# Patient Record
Sex: Male | Born: 1960 | Race: Asian | Hispanic: No | Marital: Married | Smoking: Former smoker
Health system: Southern US, Community
[De-identification: ages and names within clinical notes are randomized; demographics above are authoritative.]

## PROBLEM LIST (undated history)

## (undated) DIAGNOSIS — M79609 Pain in unspecified limb: Secondary | ICD-10-CM

## (undated) DIAGNOSIS — G63 Polyneuropathy in diseases classified elsewhere: Secondary | ICD-10-CM

## (undated) DIAGNOSIS — E291 Testicular hypofunction: Secondary | ICD-10-CM

## (undated) DIAGNOSIS — E559 Vitamin D deficiency, unspecified: Secondary | ICD-10-CM

## (undated) DIAGNOSIS — R350 Frequency of micturition: Secondary | ICD-10-CM

## (undated) DIAGNOSIS — R209 Unspecified disturbances of skin sensation: Secondary | ICD-10-CM

## (undated) DIAGNOSIS — D509 Iron deficiency anemia, unspecified: Secondary | ICD-10-CM

## (undated) DIAGNOSIS — T7840XA Allergy, unspecified, initial encounter: Secondary | ICD-10-CM

## (undated) DIAGNOSIS — R200 Anesthesia of skin: Secondary | ICD-10-CM

## (undated) DIAGNOSIS — R202 Paresthesia of skin: Secondary | ICD-10-CM

## (undated) HISTORY — PX: APPENDECTOMY: SHX54

## (undated) HISTORY — DX: Frequency of micturition: R35.0

## (undated) HISTORY — DX: Polyneuropathy in diseases classified elsewhere: G63

## (undated) HISTORY — DX: Unspecified disturbances of skin sensation: R20.9

## (undated) HISTORY — DX: Allergy, unspecified, initial encounter: T78.40XA

## (undated) HISTORY — DX: Paresthesia of skin: R20.2

## (undated) HISTORY — DX: Pain in unspecified limb: M79.609

## (undated) HISTORY — DX: Vitamin D deficiency, unspecified: E55.9

## (undated) HISTORY — DX: Testicular hypofunction: E29.1

## (undated) HISTORY — DX: Anesthesia of skin: R20.0

## (undated) HISTORY — DX: Iron deficiency anemia, unspecified: D50.9

---

## 2002-01-29 ENCOUNTER — Encounter (INDEPENDENT_AMBULATORY_CARE_PROVIDER_SITE_OTHER): Payer: Self-pay | Admitting: Specialist

## 2002-01-29 ENCOUNTER — Inpatient Hospital Stay (HOSPITAL_COMMUNITY): Admission: EM | Admit: 2002-01-29 | Discharge: 2002-01-31 | Payer: Self-pay | Admitting: Emergency Medicine

## 2002-01-29 ENCOUNTER — Encounter: Payer: Self-pay | Admitting: Emergency Medicine

## 2008-01-22 ENCOUNTER — Emergency Department (HOSPITAL_COMMUNITY): Admission: EM | Admit: 2008-01-22 | Discharge: 2008-01-22 | Payer: Self-pay | Admitting: Family Medicine

## 2008-01-29 ENCOUNTER — Emergency Department (HOSPITAL_COMMUNITY): Admission: EM | Admit: 2008-01-29 | Discharge: 2008-01-29 | Payer: Self-pay | Admitting: Family Medicine

## 2011-04-20 NOTE — Consult Note (Signed)
[place]. [facility]  Patient:    Tanner Robinson, Tanner Robinson Visit Number: [id number] MRN: [id number]          Service Type: MED Location: 1800 [id number] [id number] Attending Physician:  Tobey Bride Dictated by:   Madolyn Frieze. Jens Som, M.D. [facility] Admit Date:  01/29/2002                            Consultation Report  REASON FOR CONSULTATION:  The patient is a 50 year old Asian male with no prior cardiac history who presents for abdominal pain and has been diagnosed with appendicitis.  We were asked to evaluate preoperatively secondary to an abnormal electrocardiogram.  The patient has no prior cardiac history.  He denies any history of palpitations, presyncope, or syncope.  There is no dyspnea on exertion, orthopnea, PND, pedal edema, or chest pain.  There has been no claudication noted.  The patient was admitted with a two-day history of right lower quadrant pain that has escalated.  There was also associated chills and malaise.  An abdominal CT showed appendicitis.  He was scheduled for emergent appendectomy.  An electrocardiogram was obtained preoperatively which is consistent with Brugadas syndrome.  We were asked to further evaluated.  ALLERGIES:  The patient had no known drug allergies.  MEDICATIONS:  He is presently on no medications.  SOCIAL HISTORY:  He has a remote history of tobacco but none in the past year. He occasionally consumes alcohol.  He denies any drug use.  FAMILY HISTORY:  Negative for coronary artery disease.  He denies any sudden deaths in his immediate members.  PAST MEDICAL HISTORY:  There is no diabetes mellitus or hypertension but there is mild hyperlipidemia.  There has been no prior surgeries.  REVIEW OF SYSTEMS:  There is a low grade fever and he does complain of chills. There is no productive cough or hemoptysis.  There is no dysphagia, odynophagia, melena, or hematochezia.  There is no dysuria or hematuria. There is no rash  or seizure activity.  There is no orthopnea, PND, pedal edema.  The remainder of the review of systems are negative.  PHYSICAL EXAMINATION:  VITAL SIGNS:  Blood pressure of 143/78 and his initial temperature was 99.6. His pulse is 103 and his respiratory rate is 16.  GENERAL:  He is well-developed, well-nourished and in moderate distress.  SKIN:  Warm and dry.  HEENT:  Unremarkable with normal EOMI.  NECK:  Supple with normal upstrokes bilaterally and there are no bruits noted. There was no jugular venous distension and no thyromegaly noted.  CHEST:  Clear to auscultation.  Normal expansion.  CARDIOVASCULAR:  Tachycardic rate but regular rhythm.  There are no murmurs, rubs, or gallops noted.  ABDOMEN:  Diffuse tenderness, most pronounced in the right lower quadrant.  EXTREMITIES:  No edema.  I can palpate no cords.  PULSES:  He has 2+ dorsalis pedis pulses bilaterally.  NEUROLOGICAL:  Grossly intact.  LABORATORY DATA:  His electrocardiogram is consistent with Brugadas syndrome with ST elevation in V1 as well as V2.  His potassium is 3.9 with a BUN and creatinine of 14 and 0.7.  His white blood cell count is 13.5.  DIAGNOSES: 1. 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 2. Acute appendicitis.  PLAN:  Mr. Holifield electrocardiogram is consistent with Brugadas syndrome and this would be consistent with his Asian decent.  However, he has had no prior history of syncope, palpitations, or presyncope.  I would therefore  proceed with his scheduled surgery as you are.  Postoperatively, he needs to be on telemetry.  He will most likely require further EP evaluation with electrophysiology study followed by ICD.  However, I will need to discuss this with our electrophysiologist.  We will continue follow while he is in the hospital.Dictated by:   Madolyn Frieze. Jens Som, M.D. [facility] Attending Physician:  Tobey Bride DD:  01/29/02 TD:  01/29/02 Job: [id number]

## 2011-04-20 NOTE — H&P (Signed)
[place]. [facility]  Patient:    Tanner Robinson, Tanner Robinson Visit Number: [id number] MRN: [id number]          Service Type: EMS Location: [place] Attending Physician:  Tobey Bride Dictated by:   Angelia Mould. Derrell Lolling, M.D. Admit Date:  01/29/2002                           History and Physical  CHIEF COMPLAINT:  Abdominal pain.  HISTORY OF PRESENT ILLNESS:  This is a 50 year old [place] Asian gentleman who is previously healthy.  He noted the onset of mid and lower abdominal pain at about 1:15 yesterday.  Pain has been progressive.  The pain is now in the right lower quadrant.  He denies nausea, vomiting or alteration in his bowel habits.  He has no prior similar symptoms.  He came to the emergency room and was evaluated by Dr. Smitty Cords. Cheek.  A CT scan is consistent with acute appendicitis.  The appendix is very swollen and "about to rupture," according to Dr. Cristi Loron.  There is no abscess or free air.  There is no other abnormality on the CT scan.  PAST HISTORY:  He has been healthy.  He denies having any prior surgery, denies any medical illnesses.  CURRENT MEDICATIONS:  Tylenol only.  DRUG ALLERGIES:  None known.  FAMILY HISTORY:  Father died of liver cancer and drank a lot of alcohol. Mothers history is unknown.  REVIEW OF SYSTEMS:  Noncontributory.  PHYSICAL EXAMINATION:  A pleasant, middle-aged [place] gentleman -- speaks English quite well -- in moderate distress.  VITAL SIGNS:  Temperature 99.7, pulse 86, respirations 18, blood pressure 113/70.  HEENT:  Sclerae clear.  Extraocular movements intact.  Oropharynx clear.  NECK:  Supple, nontender.  No mass.  No bruit.  LUNGS:  Clear to auscultation.  HEART:  Regular rate and rhythm.  No murmur.  ABDOMEN:  Somewhat firm and very tender with guarding in the right lower quadrant and to a lesser extent in the left lower quadrant.  I cannot palpate a mass.  There are no  scars.  GENITALIA:  No herniae.  Normal penis, scrotum and testes.  No inflammatory process in the groin.  EXTREMITIES:  No edema.  Good pulses.  NEUROLOGIC:  Grossly within normal limits.  IMPRESSION:  Acute appendicitis.  PLAN:  The patient will be taken to the operating room for appendectomy.  I have discussed the indications and details of surgery with him.  Risks and complications have been outlined, including, but not limited to, bleeding, conversion to open laparotomy, injury to adjacent organs, wound problems such as an infection or hernia.  I have also advised him that he may not have appendicitis, this may be some other inflammatory process that may require open laparotomy, but that appendicitis is most likely.  He understands all of these issues well and at this time, all of his questions are answered.  He agrees and would like for me to go ahead with the surgery at this time. Dictated by:   Angelia Mould. Derrell Lolling, M.D. Attending Physician:  Tobey Bride DD:  01/29/02 TD:  01/29/02 Job: [id number]

## 2011-08-24 LAB — URINALYSIS, ROUTINE W REFLEX MICROSCOPIC
Bilirubin Urine: NEGATIVE
Glucose, UA: NEGATIVE
Hgb urine dipstick: NEGATIVE
Ketones, ur: NEGATIVE
Nitrite: NEGATIVE
Protein, ur: NEGATIVE
Specific Gravity, Urine: 1.017
Urobilinogen, UA: 1
pH: 7

## 2011-08-24 LAB — CBC
HCT: 34.8 — ABNORMAL LOW
Hemoglobin: 11.5 — ABNORMAL LOW
Platelets: 231
WBC: 7.9

## 2011-08-24 LAB — POCT CARDIAC MARKERS
CKMB, poc: <1 — ABNORMAL LOW
CKMB, poc: <1 — ABNORMAL LOW
Myoglobin, poc: 26.2
Operator id: 285841
Troponin i, poc: <0.05
Troponin i, poc: <0.05

## 2011-08-24 LAB — DIFFERENTIAL
Basophils Absolute: 0
Eosinophils Relative: 6 — ABNORMAL HIGH
Lymphocytes Relative: 24
Lymphs Abs: 1.9
Monocytes Absolute: 0.4
Neutro Abs: 5.1

## 2011-08-24 LAB — I-STAT 8, (EC8 V) (CONVERTED LAB)
Acid-Base Excess: 1
Chloride: 107
HCT: 39
Operator id: 285841
TCO2: 29
pCO2, Ven: 47.7
pH, Ven: 7.365 — ABNORMAL HIGH

## 2011-08-24 LAB — POCT I-STAT CREATININE: Operator id: 285841

## 2012-05-20 ENCOUNTER — Ambulatory Visit
Admission: RE | Admit: 2012-05-20 | Discharge: 2012-05-20 | Disposition: A | Discharge status: Home / Self Care | Payer: 59 | Source: Ambulatory Visit | Attending: Nephrology | Admitting: Nephrology

## 2012-05-20 ENCOUNTER — Other Ambulatory Visit: Payer: Self-pay | Admitting: Nephrology

## 2012-05-20 DIAGNOSIS — R52 Pain, unspecified: Secondary | ICD-10-CM

## 2014-04-20 ENCOUNTER — Encounter (HOSPITAL_BASED_OUTPATIENT_CLINIC_OR_DEPARTMENT_OTHER): Payer: Self-pay | Admitting: Emergency Medicine

## 2014-04-20 ENCOUNTER — Emergency Department (HOSPITAL_BASED_OUTPATIENT_CLINIC_OR_DEPARTMENT_OTHER)
Admission: EM | Admit: 2014-04-20 | Discharge: 2014-04-20 | Disposition: A | Discharge status: Home / Self Care | Payer: Worker's Compensation | Attending: Emergency Medicine | Admitting: Emergency Medicine

## 2014-04-20 ENCOUNTER — Emergency Department (HOSPITAL_BASED_OUTPATIENT_CLINIC_OR_DEPARTMENT_OTHER): Payer: Worker's Compensation

## 2014-04-20 DIAGNOSIS — W3189XA Contact with other specified machinery, initial encounter: Secondary | ICD-10-CM | POA: Insufficient documentation

## 2014-04-20 DIAGNOSIS — Z23 Encounter for immunization: Secondary | ICD-10-CM | POA: Insufficient documentation

## 2014-04-20 DIAGNOSIS — Y9289 Other specified places as the place of occurrence of the external cause: Secondary | ICD-10-CM | POA: Insufficient documentation

## 2014-04-20 DIAGNOSIS — Z79899 Other long term (current) drug therapy: Secondary | ICD-10-CM | POA: Insufficient documentation

## 2014-04-20 DIAGNOSIS — Y990 Civilian activity done for income or pay: Secondary | ICD-10-CM | POA: Insufficient documentation

## 2014-04-20 DIAGNOSIS — Z87891 Personal history of nicotine dependence: Secondary | ICD-10-CM | POA: Insufficient documentation

## 2014-04-20 DIAGNOSIS — Y9389 Activity, other specified: Secondary | ICD-10-CM | POA: Insufficient documentation

## 2014-04-20 DIAGNOSIS — S61011A Laceration without foreign body of right thumb without damage to nail, initial encounter: Secondary | ICD-10-CM

## 2014-04-20 DIAGNOSIS — S61209A Unspecified open wound of unspecified finger without damage to nail, initial encounter: Secondary | ICD-10-CM | POA: Insufficient documentation

## 2014-04-20 MED ORDER — HYDROCODONE-ACETAMINOPHEN 5-325 MG PO TABS: 2.0000 | ORAL_TABLET | ORAL | Status: DC | PRN

## 2014-04-20 MED ORDER — CEPHALEXIN 500 MG PO CAPS: 500.0000 mg | ORAL_CAPSULE | Freq: Four times a day (QID) | ORAL | Status: DC

## 2014-04-20 MED ORDER — TETANUS-DIPHTH-ACELL PERTUSSIS 5-2.5-18.5 LF-MCG/0.5 IM SUSP
0.5000 mL | Freq: Once | INTRAMUSCULAR | Status: AC
  Administered 2014-04-20: 0.5 mL via INTRAMUSCULAR
  Filled 2014-04-20: qty 0.5

## 2014-04-20 NOTE — ED Notes (Signed)
Patient transported to X-ray 

## 2014-04-20 NOTE — Discharge Instructions (Signed)
Finger Avulsion  °When the tip of the finger is lost, a new nail may grow back if part of the fingernail is left. The new nail may be deformed. If just the tip of the finger is lost, no repair may be needed unless there is bone showing. If bone is showing, your caregiver may need to remove the protruding bone and put on a bandage. Your caregiver will do what is best for you. Most of the time when a fingertip is lost, the end will gradually grow back on and look fairly normal, but it may remain sensitive to pressure and temperature extremes for a long time. °HOME CARE INSTRUCTIONS  °· Keep your hand elevated above your heart to relieve pain and swelling. °· Keep your dressing dry and clean. °· Change your bandage in 24 hours or as directed. °· Only take over-the-counter or prescription medicines for pain, discomfort, or fever as directed by your caregiver. °· See your caregiver as needed for problems. °SEEK MEDICAL CARE IF:  °· You have increased pain, swelling, drainage, or bleeding. °· You have a fever. °· You have swelling that spreads from your finger and into your hand. °Make sure to check to see if you need a tetanus booster. °Document Released: 01/28/2002 Document Revised: 05/20/2012 Document Reviewed: 12/23/2008 °ExitCare® Patient Information ©2014 [employer]. ° °

## 2014-04-20 NOTE — ED Notes (Signed)
Pt reports smashed finger in machinery at work. Pt has laceration to right thumb

## 2014-04-20 NOTE — ED Provider Notes (Signed)
CSN: [id number]     Arrival date & time 04/20/14  [id number] History   First MD Initiated Contact with Patient 04/20/14 2025     Chief Complaint  Patient presents with  . Laceration     (Consider location/radiation/quality/duration/timing/severity/associated sxs/prior Treatment) Patient is a 53 y.o. male presenting with skin laceration. The history is provided by the patient. No language interpreter was used.  Laceration Location:  Hand Length (cm):  1cm  Depth:  Through underlying tissue Quality: avulsion   Bleeding: controlled with pressure   Injury mechanism: machine. Pain details:    Quality:  Aching   Severity:  Moderate Foreign body present:  No foreign bodies Relieved by:  Nothing Ineffective treatments:  None tried Tetanus status:  Out of date   History reviewed. No pertinent past medical history. Past Surgical History  Procedure Laterality Date  . Appendectomy     No family history on file. History  Substance Use Topics  . Smoking status: Former Games developermoker  . Smokeless tobacco: Not on file  . Alcohol Use: No    Review of Systems  Skin: Positive for wound.  All other systems reviewed and are negative.     Allergies  Review of patient's allergies indicates no known allergies.  Home Medications   Prior to Admission medications   Medication Sig Start Date End Date Taking? Authorizing Provider  gabapentin (NEURONTIN) 300 MG capsule Take 300 mg by mouth 3 (three) times daily.   Yes Historical Provider, MD   BP 140/87  Pulse 70  Temp(Src) 98.1 F (36.7 C) (Oral)  Resp 16  Ht 5\' 3"  (1.6 m)  Wt 155 lb (70.308 kg)  BMI 27.46 kg/m2  SpO2 98% Physical Exam  Nursing note and vitals reviewed. Constitutional: He is oriented to person, place, and time. He appears well-developed and well-nourished.  HENT:  Head: Normocephalic.  Musculoskeletal: He exhibits tenderness.  Avulsed distal tip of right thumb,  No bone palpated,  From  Ns and nv intact  Neurological: He  is alert and oriented to person, place, and time. He has normal reflexes.  Skin: Skin is warm.  Psychiatric: He has a normal mood and affect.    ED Course  Procedures (including critical care time) Labs Review Labs Reviewed - No data to display  Imaging Review No results found.   EKG Interpretation None      MDM no fracture avulsed tip    Final diagnoses:  Laceration of right thumb     I spoke to Dr. Janee Mornhompson who advised have pt call tomorrow to be seen next week.  Lonia SkinnerLeslie K NicholasvilleSofia, PA-C 04/20/14 2134

## 2014-04-20 NOTE — ED Notes (Signed)
MD at bedside. 

## 2014-04-21 NOTE — ED Provider Notes (Signed)
Medical screening examination/treatment/procedure(s) were performed by non-physician practitioner and as supervising physician I was immediately available for consultation/collaboration.  Shanna CiscoMegan E Docherty, MD 04/21/14 2222

## 2014-04-22 ENCOUNTER — Encounter: Payer: Self-pay | Admitting: *Deleted

## 2014-05-03 ENCOUNTER — Encounter: Payer: Self-pay | Admitting: Neurology

## 2014-05-03 ENCOUNTER — Ambulatory Visit (INDEPENDENT_AMBULATORY_CARE_PROVIDER_SITE_OTHER): Payer: 59 | Admitting: Neurology

## 2014-05-03 VITALS — BP 138/80 | HR 64 | Ht 62.99 in | Wt 159.1 lb

## 2014-05-03 DIAGNOSIS — M79605 Pain in left leg: Secondary | ICD-10-CM

## 2014-05-03 DIAGNOSIS — R209 Unspecified disturbances of skin sensation: Secondary | ICD-10-CM

## 2014-05-03 DIAGNOSIS — M79609 Pain in unspecified limb: Secondary | ICD-10-CM

## 2014-05-03 NOTE — Patient Instructions (Signed)
1.  EMG of the left leg 2.  Continue neurontin as you are 3.  Return to clinic in 6 weeks  ELECTROMYOGRAM AND NERVE CONDUCTION STUDIES (EMG/NCS) INSTRUCTIONS  How to Prepare The neurologist conducting the EMG will need to know if you have certain medical conditions. Tell the neurologist and other EMG lab personnel if you:   Have a pacemaker or any other electrical medical device   Take blood-thinning medications   Have hemophilia, a blood-clotting disorder that causes prolonged bleeding Bathing Take a shower or bath shortly before your exam in order to remove oils from your skin. Don't apply lotions or creams before the exam.  What to Expect You'll likely be asked to change into a hospital gown for the procedure and lie down on an examination table. The following explanations can help you understand what will happen during the exam.    Electrodes. The neurologist or a technician places surface electrodes at various locations on your skin depending on where you're experiencing symptoms. Or the neurologist may insert needle electrodes at different sites depending on your symptoms.    Sensations. The electrodes will at times transmit a tiny electrical current that you may feel as a twinge or spasm. The needle electrode may cause discomfort or pain that usually ends shortly after the needle is removed. If you are concerned about discomfort or pain, you may want to talk to the neurologist about taking a short break during the exam.    Instructions. During the needle EMG, the neurologist will assess whether there is any spontaneous electrical activity when the muscle is at rest - activity that isn't present in healthy muscle tissue - and the degree of activity when you slightly contract the muscle.  He or she will give you instructions on resting and contracting a muscle at appropriate times. Depending on what muscles and nerves the neurologist is examining, he or she may ask you to change positions  during the exam.  After your EMG You may experience some temporary, minor bruising where the needle electrode was inserted into your muscle. This bruising should fade within several days. If it persists, contact your primary care doctor.

## 2014-05-03 NOTE — Progress Notes (Signed)
Note faxed.

## 2014-05-03 NOTE — Progress Notes (Signed)
[facility] Neurology Division Clinic Note - Initial Visit   Date: 05/03/2014  Tanner Robinson MRN: [id number] DOB: [birth year]   Dear Dr Julio Sicks:  Thank you Robinson your kind referral of Tanner Robinson consultation of left leg pain/paresthesias. Although his history is well known to you, please allow [place] to reiterate it Robinson the purpose of our medical record. The patient was accompanied to the clinic by his Robinson who also provides collateral information.     History of Present Illness: Tanner Robinson is a 53 y.o. right-handed male from [place] with history of iron deficient anemia presenting Robinson evaluation of left leg pain/paresthesias.    Robinson the past 20 years, he developed left leg pain, described achy and shooting pain. It worsened a few years ago. Pain is located over the buttocks, back of the thigh, lateral surface of the left lower leg and foot.  He has associated numbness.  Denies back pain or tingling.  It is improved when he applies pressure over his foot.  Pain is worse with prolonged standing and resting. There mild improvement with neurontin, but discomfort persists.  He has not tried any physical therapy.  He tried heat pack and massage which did not help.  He reports having a MRI of the back that did not show any abnormalities in the mid 1990s.  He saw his PCP in April who started him on neurontin 300mg  TID and referred her Robinson further evaluation.   Past Medical History  Diagnosis Date  . Urinary frequency   . Numbness and tingling of both legs   . Unspecified vitamin D deficiency   . Pain in limb   . Disturbance of skin sensation   . 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 Iron deficiency anemia, unspecified   . Other testicular hypofunction   . Allergy, unspecified not elsewhere classified     Past Surgical History  Procedure Laterality Date  . Appendectomy       Medications:  Current Outpatient  Prescriptions on File Prior to Visit  Medication Sig Dispense Refill  . cephALEXin (KEFLEX) 500 MG capsule Take 1 capsule (500 mg total) by mouth 4 (four) times daily.  28 capsule  0  . Cholecalciferol (VITAMIN D3) 2000 UNITS TABS Take 1 tablet by mouth daily.      Marland Kitchen gabapentin (NEURONTIN) 300 MG capsule Take 300 mg by mouth 3 (three) times daily.      Marland Kitchen HYDROcodone-acetaminophen (NORCO/VICODIN) 5-325 MG per tablet Take 2 tablets by mouth every 4 (four) hours as needed.  20 tablet  0  . Multiple Vitamin (MULTIVITAMIN) tablet Take 1 tablet by mouth daily.      Marland Kitchen testosterone (ANDRODERM) 4 MG/24HR PT24 patch Place 1 patch onto the skin daily.       No current facility-administered medications on file prior to visit.    Allergies: No Known Allergies  Family History: History reviewed. No pertinent family history.  Social History: History   Social History  . Marital Status: Married    Spouse Name: N/A    Number of Children: N/A  . Years of Education: N/A   Occupational History  . Not on file.   Social History Main Topics  . Smoking status: Never Smoker   . Smokeless tobacco: Never Used  . Alcohol Use: No  . Drug Use: No  . Sexual Activity: Not on file   Other Topics Concern  . Not on file   Social History Narrative  . No narrative on  file    Review of Systems:  CONSTITUTIONAL: No fevers, chills, night sweats, or weight loss.   EYES: No visual changes or eye pain ENT: No hearing changes.  No history of nose bleeds.   RESPIRATORY: No cough, wheezing and shortness of breath.   CARDIOVASCULAR: Negative Robinson chest pain, and palpitations.   GI: Negative Robinson abdominal discomfort, blood in stools or black stools.  No recent change in bowel habits.   GU:  No history of incontinence.   MUSCLOSKELETAL: No history of joint pain or swelling.  No myalgias.   SKIN: Negative Robinson lesions, rash, and itching.   HEMATOLOGY/ONCOLOGY: Negative Robinson prolonged bleeding, bruising easily, and  swollen nodes.  No history of cancer.   ENDOCRINE: Negative Robinson cold or heat intolerance, polydipsia or goiter.   PSYCH:  No depression or anxiety symptoms.   NEURO: As Above.   Vital Signs:  BP 138/80  Pulse 64  Ht 5' 2.99" (1.6 m)  Wt 159 lb 2 oz (72.179 kg)  BMI 28.19 kg/m2  SpO2 96%   General Medical Exam:   General:  Well appearing, comfortable.   Eyes/ENT: see cranial nerve examination.   Neck: No masses appreciated.  Full range of motion without tenderness.  No carotid bruits. Respiratory:  Clear to auscultation, good air entry bilaterally.   Cardiac:  Regular rate and rhythm, no murmur.   Back:  No pain to palpation of spinous processes.   Extremities:  No deformities, edema, or skin discoloration. Good capillary refill.  Pain to palpation distal to left fibular head. Skin:  Skin color, texture, turgor normal. No rashes or lesions.  Neurological Exam: MENTAL STATUS including orientation to time, place, person, recent and remote memory, attention span and concentration, language, and fund of knowledge is normal.  Heavy asian accent.  CRANIAL NERVES: II:  No visual field defects.  Unremarkable fundi.   III-IV-VI: Pupils equal round and reactive to light.  Normal conjugate, extra-ocular eye movements in all directions of gaze.  No nystagmus.  No ptosis.   V:  Normal facial sensation.   VII:  Normal facial symmetry and movements.   VIII:  Normal hearing and vestibular function.   IX-X:  Normal palatal movement.   XI:  Normal shoulder shrug and head rotation.   XII:  Normal tongue strength and range of motion, no deviation or fasciculation.  MOTOR:  No atrophy, fasciculations or abnormal movements.  No pronator drift.  Tone is normal.  Straight leg raise mildly positive on left.  Right Upper Extremity:    Left Upper Extremity:    Deltoid  5/5   Deltoid  5/5   Biceps  5/5   Biceps  5/5   Triceps  5/5   Triceps  5/5   Wrist extensors  5/5   Wrist extensors  5/5   Wrist  flexors  5/5   Wrist flexors  5/5   Finger extensors  5/5   Finger extensors  5/5   Finger flexors  5/5   Finger flexors  5/5   Dorsal interossei  5/5   Dorsal interossei  5/5   Abductor pollicis  5/5   Abductor pollicis  5/5   Tone (Ashworth scale)  0  Tone (Ashworth scale)  0   Right Lower Extremity:    Left Lower Extremity:    Hip flexors  5/5   Hip flexors  5/5   Hip extensors  5/5   Hip extensors  5/5   Knee flexors  5/5  Knee flexors  5/5   Knee extensors  5/5   Knee extensors  5/5   Dorsiflexors  5/5   Dorsiflexors  5/5   Plantarflexors  5/5   Plantarflexors  5/5   Toe extensors  5/5   Toe extensors  5/5   Toe flexors  5/5   Toe flexors  5/5   Tone (Ashworth scale)  0  Tone (Ashworth scale)  0   MSRs:  Right                                                                 Left brachioradialis 2+  brachioradialis 2+  biceps 2+  biceps 2+  triceps 2+  triceps 2+  patellar 2+  patellar 2+  ankle jerk 2+  ankle jerk 2+  Hoffman no  Hoffman no  plantar response down  plantar response down    SENSORY:  Normal and symmetric perception of light touch, pinprick, vibration, and proprioception.  Romberg's sign absent.   COORDINATION/GAIT: Normal finger-to- nose-finger and heel-to-shin.  Intact rapid alternating movements bilaterally.  Able to rise from a chair without using arms.  Gait narrow based and stable. Tandem and stressed gait intact.    IMPRESSION: Mr. Tanner Robinson is a [age] year-old gentleman presenting Robinson evaluation of left leg pain and numbness.  His neurological exam is normal.  Based on history, possibilities include sciatica, S1 radiculopathy, and peroneal mononeuropathy (less likely given symptoms involving his upper leg). EMG will be ordered to help localize and better characterize his symptoms.  In the meantime, neurontin 300mg  TID can be continued.  I'll see him back in 6 weeks   The duration of this appointment visit was 30 minutes of face-to-face time with the  patient.  Greater than 50% of this time was spent in counseling, explanation of diagnosis, planning of further management, and coordination of care.   Thank you Robinson allowing me to participate in patient's care.  If I can answer any additional questions, I would be pleased to do so.    Sincerely,    Vernel Langenderfer K. Allena KatzPatel, DO

## 2014-06-14 ENCOUNTER — Encounter: Payer: Self-pay | Admitting: Neurology

## 2014-06-14 ENCOUNTER — Ambulatory Visit (INDEPENDENT_AMBULATORY_CARE_PROVIDER_SITE_OTHER): Payer: 59 | Admitting: Neurology

## 2014-06-14 DIAGNOSIS — M79609 Pain in unspecified limb: Secondary | ICD-10-CM

## 2014-06-14 DIAGNOSIS — M79605 Pain in left leg: Secondary | ICD-10-CM

## 2014-06-14 DIAGNOSIS — R209 Unspecified disturbances of skin sensation: Secondary | ICD-10-CM

## 2014-06-14 DIAGNOSIS — M5417 Radiculopathy, lumbosacral region: Secondary | ICD-10-CM

## 2014-06-14 NOTE — Procedures (Signed)
[facility]  [address], [address]  [place], [place] [address] Tel: [phone] Fax:  [phone] Test Date:  06/14/2014  Patient: Tanner Robinson DOB: Jun 09, 1961 Physician: Nita Sickleonika Lucciana Head, DO  Sex: Male Height: 5\' 3"  Ref Phys: Nita Sickleatel, Dyonna Jaspers  ID#: [id number] Temp: 32C Technician: Ala BentSusan Reid R. NCS T.   Patient Complaints: Patient is a 53 year old male here today for evaluation of his left leg pain and numbness.  NCV & EMG Findings: Extensive electrodiagnostic testing of the left lower extremity reveals:  1. Sural and superficial peroneal sensory responses are within normal limits.  2. The peroneal and tibial motor responses are within normal limits but  3. Sparse chronic motor axon loss changes are seen affecting the gastrocnemius and biceps femoris short head muscle, without accompanied active denervation.    Impression: The electrophysiologic findings are consistent with a very mild chronic S1 radiculopathy affecting the left lower extremity. In particular, there is no evidence of a generalized sensorimotor polyneuropathy affecting the left lower extremity.   ___________________________ Nita Sickleonika Dupree Givler, DO    Nerve Conduction Studies Anti Sensory Summary Table   Site NR Peak (ms) Norm Peak (ms) P-T Amp (V) Norm P-T Amp  Left Sup Peroneal Anti Sensory (Ant Lat Mall)  12 cm    2.7 <4.6 17.3 >4  Left Sural Anti Sensory (Lat Mall)  Calf    3.4 <4.6 15.9 >4   Motor Summary Table   Site NR Onset (ms) Norm Onset (ms) O-P Amp (mV) Norm O-P Amp Site1 Site2 Delta-0 (ms) Dist (cm) Vel (m/s) Norm Vel (m/s)  Left Peroneal Motor (Ext Dig Brev)  32C  Ankle    4.1 <6.0 5.1 >2.5 B Fib Ankle 5.3 27.0 51 >40  B Fib    9.4  4.9  Poplt B Fib 2.0 10.0 50 >40  Poplt    11.4  4.4         Left Tibial Motor (Abd Hall Brev)  32C  Ankle    3.4 <6.0 10.4 >4 Knee Ankle 6.6 35.0 53 >40  Knee    10.0  9.1          F Wave Studies   NR F-Lat (ms) Lat Norm (ms) L-R F-Lat  (ms)  Left Tibial (Mrkrs) (Abd Hallucis)  32C     45.40 <55    EMG   Side Muscle Ins Act Fibs Psw Fasc Number Recrt Dur Dur. Amp Amp. Poly Poly. Comment  Left AntTibialis Nml Nml Nml Nml Nml Nml Nml Nml Nml Nml Nml Nml N/A  Left Gastroc Nml Nml Nml Nml 1- Mod-V Few 1+ Nml Nml Nml Nml N/A  Left Flex Dig Long Nml Nml Nml Nml Nml Nml Nml Nml Nml Nml Nml Nml N/A  Left RectFemoris Nml Nml Nml Nml Nml Nml Nml Nml Nml Nml Nml Nml N/A  Left GluteusMax Nml Nml Nml Nml Nml Nml Nml Nml Nml Nml Nml Nml N/A  Left BicepsFemS Nml Nml Nml Nml 1- Mod-R Few 1+ Few 1+ Nml Nml N/A      Waveforms:

## 2014-06-21 ENCOUNTER — Encounter: Payer: Self-pay | Admitting: Neurology

## 2014-06-21 ENCOUNTER — Ambulatory Visit (INDEPENDENT_AMBULATORY_CARE_PROVIDER_SITE_OTHER): Payer: 59 | Admitting: Neurology

## 2014-06-21 VITALS — BP 144/74 | HR 72 | Ht 62.6 in | Wt 160.2 lb

## 2014-06-21 DIAGNOSIS — M79605 Pain in left leg: Secondary | ICD-10-CM

## 2014-06-21 DIAGNOSIS — M79609 Pain in unspecified limb: Secondary | ICD-10-CM

## 2014-06-21 NOTE — Progress Notes (Signed)
Put in in provider comments.

## 2014-06-21 NOTE — Progress Notes (Signed)
Follow-up Visit   Date: 06/21/2014    Tanner Robinson MRN: [id number] DOB: 01-24-1961   Interim History: Tanner Cleverhonesavanh Messamore is a 53 y.o. right-handed male from [place] with history of iron deficient anemia returning to the clinic for follow-up of left leg pain and paresthesias.  The patient was accompanied to the clinic by self.  History of present illness: For the past 20 years, he developed left leg pain, described achy and shooting pain. It worsened a few years ago. Pain is located over the buttocks, back of the thigh, lateral surface of the left lower leg and foot. He has associated numbness. Denies back pain or tingling. It is improved when he applies pressure over his foot. Pain is worse with prolonged standing and resting. There mild improvement with neurontin, but discomfort persists. He has not tried any physical therapy. He tried heat pack and massage which did not help. He reports having a MRI of the back that did not show any abnormalities in the mid 1990s. He saw his PCP in April who started him on neurontin 300mg  TID and referred her for further evaluation.  UPDATE 06/21/2014:  He underwent EMG which showed very mild left S1 radiculopathy. There has been no interval change in his symptoms. He does report that hyperinversion of the left foot with his knee and hip flexed tends to alleviate his symptoms. He continues have pain over a very localized area involving the lateral proximal lower leg.  He reports that massaging the muscle and compressive wrapping helps his pain.     Medications:  Current Outpatient Prescriptions on File Prior to Visit  Medication Sig Dispense Refill  . cephALEXin (KEFLEX) 500 MG capsule Take 1 capsule (500 mg total) by mouth 4 (four) times daily.  28 capsule  0  . Cholecalciferol (VITAMIN D3) 2000 UNITS TABS Take 1 tablet by mouth daily.      Marland Kitchen. gabapentin (NEURONTIN) 300 MG capsule Take 300 mg by mouth 3 (three) times daily.      Marland Kitchen.  HYDROcodone-acetaminophen (NORCO/VICODIN) 5-325 MG per tablet Take 2 tablets by mouth every 4 (four) hours as needed.  20 tablet  0  . Multiple Vitamin (MULTIVITAMIN) tablet Take 1 tablet by mouth daily.      Marland Kitchen. testosterone (ANDRODERM) 4 MG/24HR PT24 patch Place 1 patch onto the skin daily.       No current facility-administered medications on file prior to visit.    Allergies: No Known Allergies   Review of Systems:  CONSTITUTIONAL: No fevers, chills, night sweats, or weight loss.   EYES: No visual changes or eye pain ENT: No hearing changes.  No history of nose bleeds.   RESPIRATORY: No cough, wheezing and shortness of breath.   CARDIOVASCULAR: Negative for chest pain, and palpitations.   GI: Negative for abdominal discomfort, blood in stools or black stools.  No recent change in bowel habits.   GU:  No history of incontinence.   MUSCLOSKELETAL: No history of joint pain or swelling.  No myalgias.   SKIN: Negative for lesions, rash, and itching.   ENDOCRINE: Negative for cold or heat intolerance, polydipsia or goiter.   PSYCH:  No depression or anxiety symptoms.   NEURO: As Above.   Vital Signs:  BP 144/74  Pulse 72  Ht 5' 2.6" (1.59 m)  Wt 160 lb 3 oz (72.661 kg)  BMI 28.74 kg/m2  SpO2 98%  Neurological Exam: MENTAL STATUS including orientation to time, place, person, recent and remote memory, attention span  and concentration, language, and fund of knowledge is normal.  Speech is not dysarthric.  MOTOR:  Motor strength is 5/5 in all extremities, including foot eversion and inversion, toe flexion and extension.  No atrophy, fasciculations or abnormal movements. Tone is normal.    MSRs:  Reflexes are 2+/4 throughout.  SENSORY:  Intact to light touch.  COORDINATION/GAIT: Gait narrow based and stable.   Data: EMG 06/14/2014: The electrophysiologic findings are consistent with a very mild chronic S1 radiculopathy affecting the left lower extremity. In particular, there is no  evidence of a generalized sensorimotor polyneuropathy affecting the left lower extremity.   IMPRESSION/PLAN: Mr. Kindle is a 53 year-old gentleman returning for evaluation of chronic left leg discomfort. His neurological exam is normal. He underwent EMG which did not show evidence of a peroneal mononeuropathy. There is a very mild S1 radiculopathy, but based on his symptomatology, it seems to be too mild to account for all of his pain. Other possibilities include sciatica from piriformis syndrome. I have discussed starting physical therapy for stretching and adding muscle relaxant to his medications, however patient reports not having any benefit with muscle relaxants in the past. He would like to seek a second opinion so I will refer him to Dr. Antoine Primas in Sports Medicine.   The duration of this appointment visit was 15 minutes of face-to-face time with the patient.  Greater than 50% of this time was spent in counseling, explanation of diagnosis, planning of further management, and coordination of care.   Thank you for allowing me to participate in patient's care.  If I can answer any additional questions, I would be pleased to do so.    Sincerely,    Zailynn Brandel K. Allena Katz, DO

## 2014-06-21 NOTE — Patient Instructions (Signed)
Referral to Dr. Antoine PrimasZachary Robinson Continue to stretch and ice/heat your left leg.

## 2014-07-02 ENCOUNTER — Ambulatory Visit (INDEPENDENT_AMBULATORY_CARE_PROVIDER_SITE_OTHER): Payer: 59 | Admitting: Family Medicine

## 2014-07-02 ENCOUNTER — Other Ambulatory Visit (INDEPENDENT_AMBULATORY_CARE_PROVIDER_SITE_OTHER): Payer: 59

## 2014-07-02 ENCOUNTER — Encounter: Payer: Self-pay | Admitting: Family Medicine

## 2014-07-02 VITALS — BP 124/82 | HR 68 | Ht 63.0 in | Wt 156.0 lb

## 2014-07-02 DIAGNOSIS — M79605 Pain in left leg: Secondary | ICD-10-CM

## 2014-07-02 DIAGNOSIS — M79609 Pain in unspecified limb: Secondary | ICD-10-CM

## 2014-07-02 DIAGNOSIS — M76892 Other specified enthesopathies of left lower limb, excluding foot: Secondary | ICD-10-CM | POA: Insufficient documentation

## 2014-07-02 DIAGNOSIS — M6580 Other synovitis and tenosynovitis, unspecified site: Secondary | ICD-10-CM

## 2014-07-02 MED ORDER — DICLOFENAC SODIUM 2 % TD SOLN: TRANSDERMAL | Status: DC

## 2014-07-02 NOTE — Progress Notes (Signed)
Tanner Robinson D.O. [facility] [address] [place], [place] [address] Phone: [phone] Subjective:    I'm seeing this patient by the request  of:  Allena Katzatel  CC: Left leg pain  BJY:[id number]:Subjective Tanner Robinson is a 53 y.o. male coming in with complaint of leg pain on the left side. Patient has had this pain for multiple years. Patient has seen neurology for this without any significant improvement. Patient states that he has had this pain for over 20 years. Describes it as more of an aching pain but usually starts on the lateral aspect of his calf and seems to radiate up towards his buttock. Patient states he can be on the back of the thigh and go all the way down near his foot from time to time. He states that sometimes it can be an associated numbness but this seems to be rare. States that it is worse when he stands for a prolonged amount of time. Patient has been given gabapentin which takes the edge off but never completely goes the pain. Denies any side effects to the medication. Patient reports that he has had further imaging including MRI of his back but did not show any significant abnormalities. Patient also denies any back pain associated with it. Patient has tried other home modalities including over-the-counter anti-inflammatories and heat packs as well as massage without any improvement.  While under the care of Dr. Allena KatzPatel the patient was also given an EMG that showed mild left S1 radiculopathy.    X-rays are reviewed by me today. May 20, 2012 patient did have x-rays of the tibia and fibula which are negative. Patient also had back x-rays at the same time that were negative with no osteoarthritic changes.  Patient still does remain active and is able to play soccer as well as volleyball. Patient states afterwards though he does have more pain.     Past medical history, social, surgical and family history all reviewed in electronic medical record.   Review of  Systems: No headache, visual changes, nausea, vomiting, diarrhea, constipation, dizziness, abdominal pain, skin rash, fevers, chills, night sweats, weight loss, swollen lymph nodes, body aches, joint swelling, muscle aches, chest pain, shortness of breath, mood changes.   Objective Blood pressure 124/82, pulse 68, height 5\' 3"  (1.6 m), weight 156 lb (70.761 kg), SpO2 98.00%.  General: No apparent distress alert and oriented x3 mood and affect normal, dressed appropriately.  HEENT: Pupils equal, extraocular movements intact  Respiratory: Patient's speak in full sentences and does not appear short of breath  Cardiovascular: No lower extremity edema, non tender, no erythema  Skin: Warm dry intact with no signs of infection or rash on extremities or on axial skeleton.  Abdomen: Soft nontender  Neuro: Cranial nerves II through XII are intact, neurovascularly intact in all extremities with 2+ DTRs and 2+ pulses.  Lymph: No lymphadenopathy of posterior or anterior cervical chain or axillae bilaterally.  Gait normal with good balance and coordination.  MSK:  Non tender with full range of motion and good stability and symmetric strength and tone of shoulders, elbows, wrist, hip, knee and ankles bilaterally.  Back Exam:  Inspection: Unremarkable  Motion: Flexion 45 deg, Extension 45 deg, Side Bending to 45 deg bilaterally,  Rotation to 45 deg bilaterally  SLR laying: Negative  XSLR laying: Negative  Palpable tenderness: None. FABER: negative. Sensory change: Gross sensation intact to all lumbar and sacral dermatomes.  Reflexes: 2+ at both patellar tendons, 2+ at achilles tendons,  Babinski's downgoing.  Strength at foot  Plantar-flexion: 5/5 Dorsi-flexion: 5/5 Eversion: 5/5 Inversion: 5/5  Leg strength  Quad: 5/5 Hamstring: 5/5 Hip flexor: 5/5 Hip abductors: 5/5  Gait unremarkable. Patient actually does have severe tightness of the hamstrings bilaterally left greater than right. Patient is tender  at the insertion of the hamstring on the lateral aspect of the lower leg as well as over the ischial tuberosity.  MSK US performed of: Left knee This study was ordered, performed, and interpreted by Terrilee Files D.O.  Knee: Left All structures visualized. Anteromedial, anterolateral, posteromedial, and posterolateral menisci unremarkable without tearing, fraying, effusion, or displacement. Patellar Tendon unremarkable on long and transverse views without effusion. No abnormality of prepatellar bursa. LCL and MCL unremarkable on long and transverse views. No abnormality of origin of medial or lateral head of the gastrocnemius. Patient though does have hamstring tendon with hypoechoic changes of potential partial tear that seems to be chronic in nature. Patient does have interest in anatomical changes with patient having what appears to be compression of the popliteal artery coming laterally between the distal hamstring tendon as well as the fibular head.  IMPRESSION:  Distal hamstring tendinitis with questionable abnormal anatomy.     Impression and Recommendations:     This case required medical decision making of moderate complexity.

## 2014-07-02 NOTE — Patient Instructions (Addendum)
Very nice to meet you Ice 20 minutes 2 times daily. Usually after activity, work and before bed. Wear the compression daily especially at work and with soccer.  Exercises 3 times a week Start askling exercises in 2 weeks.  Pennsaid twice daily to area.  Come back in 3 weeks.

## 2014-07-02 NOTE — Assessment & Plan Note (Signed)
The patient's findings are consistent with more of a hamstring injury. Patient is having the pain on the lateral aspect echoes near the insertion just distal to the knee joint itself. In addition a that patient's buttocks pain is likely secondary to pulling on the ischial tuberosity. We discussed different treatment options. Patient was given a compression sleeve today, and topical anti-inflammatories, icing protocol and will come back again in 3 weeks. Differential does include the anatomical differences ICU the patient's popliteal artery going more lateral than staying in the popliteal fossa. I think he could be getting compression with extended amount of tightness to the hamstring tendon. This could be giving us a pseudo-claudication that could also be contributing to his discomfort. Patient is already had a significant workup for his back previously which was unremarkable. Patient does have some S1 radicular symptoms an EMG show some mild abnormal signals we will continue to monitor but is less likely. Patient once again will come back in 3 weeks for further evaluation and treatment.

## 2014-07-26 ENCOUNTER — Ambulatory Visit (INDEPENDENT_AMBULATORY_CARE_PROVIDER_SITE_OTHER): Payer: 59 | Admitting: Family Medicine

## 2014-07-26 ENCOUNTER — Other Ambulatory Visit (INDEPENDENT_AMBULATORY_CARE_PROVIDER_SITE_OTHER): Payer: 59

## 2014-07-26 ENCOUNTER — Encounter: Payer: Self-pay | Admitting: Family Medicine

## 2014-07-26 VITALS — BP 122/72 | HR 67 | Ht 63.0 in | Wt 159.0 lb

## 2014-07-26 DIAGNOSIS — M76892 Other specified enthesopathies of left lower limb, excluding foot: Secondary | ICD-10-CM

## 2014-07-26 DIAGNOSIS — M6580 Other synovitis and tenosynovitis, unspecified site: Secondary | ICD-10-CM

## 2014-07-26 DIAGNOSIS — G5782 Other specified mononeuropathies of left lower limb: Secondary | ICD-10-CM | POA: Insufficient documentation

## 2014-07-26 DIAGNOSIS — G5780 Other specified mononeuropathies of unspecified lower limb: Secondary | ICD-10-CM

## 2014-07-26 NOTE — Assessment & Plan Note (Signed)
Patient did have injection today. No foot drop after injection. Patient did have some numbness but that is it. Patient was able to ambulate without any difficulty. Patient will do icing as well as the continued any topical anti-inflammatory and a compression. The patient is significantly well he can come back and see me again in 3-4 weeks for further evaluation.  Spent greater than 25 minutes with patient face-to-face and had greater than 50% of counseling including as described above in assessment and plan.

## 2014-07-26 NOTE — Patient Instructions (Signed)
It is good to see you Tanner Robinson will always be your friend.  Exercises 3 times a week still for another 6 weeks.  Conitinue the compression with exercise.  I am hoping the injection helps Exercises for the shoulder 3 times a week See you again in 3-4 weeks.

## 2014-07-26 NOTE — Progress Notes (Signed)
Tanner Robinson [address] [place], [place] [address] Phone: [phone] Subjective:    CC: Left leg pain  [id number] Tanner Robinson is a 53 y.o. male coming in with complaint of leg pain on the left side. This has been a chronic problem. Patient was diagnosed with more of a hamstring tendinitis distally. Patient was given a compression sleeve, icing protocol, and we discussed over-the-counter medications it could be helpful. Patient states overall he is limited 5-10% improvement. Patient feels that his legs are stronger but continues to have the same pain. Still starts near the fibular head and can radiate up to his buttocks area. States that it is worse with standing for long amount of time. Continues to be able to do daily activities without any significant difficulty and is resting comfortably at night. Topical anti-inflammatory does help.  While under the care of Dr. Allena Katz the patient was also given an EMG that showed mild left S1 radiculopathy.    X-rays are reviewed by me today. May 20, 2012 patient did have x-rays of the tibia and fibula which are negative. Patient also had back x-rays at the same time that were negative with no osteoarthritic changes.  Patient still does remain active and is able to play soccer as well as volleyball. Patient states afterwards though he does have more pain.     Past medical history, social, surgical and family history all reviewed in electronic medical record.   Review of Systems: No headache, visual changes, nausea, vomiting, diarrhea, constipation, dizziness, abdominal pain, skin rash, fevers, chills, night sweats, weight loss, swollen lymph nodes, body aches, joint swelling, muscle aches, chest pain, shortness of breath, mood changes.   Objective Blood pressure 122/72, pulse 67, height  (1.6 m), weight 159 lb (72.122 kg), SpO2 99.00%.  General: No apparent distress alert and oriented x3 mood and  affect normal, dressed appropriately.  HEENT: Pupils equal, extraocular movements intact  Respiratory: Patient's speak in full sentences and does not appear short of breath  Cardiovascular: No lower extremity edema, non tender, no erythema  Skin: Warm dry intact with no signs of infection or rash on extremities or on axial skeleton.  Abdomen: Soft nontender  Neuro: Cranial nerves II through XII are intact, neurovascularly intact in all extremities with 2+ DTRs and 2+ pulses.  Lymph: No lymphadenopathy of posterior or anterior cervical chain or axillae bilaterally.  Gait normal with good balance and coordination.  MSK:  Non tender with full range of motion and good stability and symmetric strength and tone of shoulders, elbows, wrist, hip, knee and ankles bilaterally.  Back Exam:  Inspection: Unremarkable  Motion: Flexion 45 deg, Extension 45 deg, Side Bending to 45 deg bilaterally,  Rotation to 45 deg bilaterally  SLR laying: Negative  XSLR laying: Negative  Palpable tenderness: None. FABER: negative. Sensory change: Gross sensation intact to all lumbar and sacral dermatomes.  Reflexes: 2+ at both patellar tendons, 2+ at achilles tendons, Babinski's downgoing.  Strength at foot  Plantar-flexion: 5/5 Dorsi-flexion: 5/5 Eversion: 5/5 Inversion: 5/5  Leg strength  Quad: 5/5 Hamstring: 5/5 Hip flexor: 5/5 Hip abductors: 5/5  Gait unremarkable. Patient actually does have severe tightness of the hamstrings bilaterally left greater than right. Patient is tender at the insertion of the hamstring on the lateral aspect of the lower leg as well as over the ischial tuberosity.  MSK US performed of: Left knee This study was ordered, performed, and interpreted by Tanner Robinson D.O.  Knee:  Left All structures visualized. Anteromedial, anterolateral, posteromedial, and posterolateral menisci unremarkable without tearing, fraying, effusion, or displacement. Patellar Tendon unremarkable on long and  transverse views without effusion. No abnormality of prepatellar bursa. LCL and MCL unremarkable on long and transverse views. No abnormality of origin of medial or lateral head of the gastrocnemius. Patient though does have hamstring tendon with hypoechoic changes the tear seems to be improved Patient does have  anatomical changes with patient having what appears to be compression of the popliteal artery coming laterally between the distal hamstring tendon as well as the fibular head. I am able to see sural nerve with scarring.   IMPRESSION:  Distal hamstring tendinitis with questionable abnormal anatomy.   Verbal consent patient was prepped with alcohol swabs and under ultrasound guidance was injected with 0.5 cc of 0.5% Marcaine and 0.5 cc of Kenalog 40 mg/dL surrounding the scarred sural nerve and hydrodissection was accomplished. Patient tolerated the procedure with complete resolution of pain. Patient did have numbness in his toes. Patient did understand the risks and benefits before the injection. Patient still had full strength after the exam of the ankle. No drop foot with associated. Postinjection instructions given.   Impression and Recommendations:     This case required medical decision making of moderate complexity.

## 2014-08-23 ENCOUNTER — Ambulatory Visit: Payer: Self-pay | Admitting: Family Medicine

## 2014-09-06 ENCOUNTER — Ambulatory Visit (INDEPENDENT_AMBULATORY_CARE_PROVIDER_SITE_OTHER): Payer: 59 | Admitting: Family Medicine

## 2014-09-06 ENCOUNTER — Encounter: Payer: Self-pay | Admitting: Family Medicine

## 2014-09-06 ENCOUNTER — Other Ambulatory Visit (INDEPENDENT_AMBULATORY_CARE_PROVIDER_SITE_OTHER): Payer: 59

## 2014-09-06 VITALS — BP 132/80 | HR 70 | Ht 63.0 in | Wt 158.0 lb

## 2014-09-06 DIAGNOSIS — M76892 Other specified enthesopathies of left lower limb, excluding foot: Secondary | ICD-10-CM

## 2014-09-06 DIAGNOSIS — M7072 Other bursitis of hip, left hip: Secondary | ICD-10-CM

## 2014-09-06 DIAGNOSIS — M65852 Other synovitis and tenosynovitis, left thigh: Secondary | ICD-10-CM

## 2014-09-06 NOTE — Assessment & Plan Note (Addendum)
More proximal this time. Patient did have ultrasound-guided injection and its insertion. Patient tolerated the procedure very well. Patient did have some decreased pain after the injection. Patient will do more conservative therapy and increase significant activity for right now. We discussed compression short second be more beneficial. We discussed icing as well and manual massage I can be beneficial. Patient will return in 3 weeks for further evaluation and treatment.  Spent greater than 25 minutes with patient face-to-face and had greater than 50% of counseling including as described above in assessment and plan.

## 2014-09-06 NOTE — Progress Notes (Signed)
[facility] [address] [place], [place] [address] Phone: [phone] Subjective:    CC: Left leg pain  BJY:NWGNFAOZHY Tanner Robinson is a 53 y.o. male coming in with complaint of leg pain on the left side. This has been a chronic problem. Patient was diagnosed with more of a hamstring tendinitis distally.  Patient did have injection around the sural nerve and states that this seems to have improved. Patient though states that now unfortunately the proximal aspect of the hamstring seems to be significantly more tight. The still hurting him more when he stands for long amount of time or stays in any position for a long amount of time. Patient continues to topical anti-inflammatories on a fairly regular basis. Denies any numbness or tingling. States that seems to be localized not getting better. Very tight when he bends over to pick something up.  While under the care of Dr. Allena Katz the patient was also given an EMG that showed mild left S1 radiculopathy.    X-rays are reviewed by me today. May 20, 2012 patient did have x-rays of the tibia and fibula which are negative. Patient also had back x-rays at the same time that were negative with no osteoarthritic changes.    Past medical history, social, surgical and family history all reviewed in electronic medical record.   Review of Systems: No headache, visual changes, nausea, vomiting, diarrhea, constipation, dizziness, abdominal pain, skin rash, fevers, chills, night sweats, weight loss, swollen lymph nodes, body aches, joint swelling, muscle aches, chest pain, shortness of breath, mood changes.   Objective Blood pressure 132/80, pulse 70, height 5\' 3"  (1.6 m), weight 158 lb (71.668 kg), SpO2 97.00%.  General: No apparent distress alert and oriented x3 mood and affect normal, dressed appropriately.  HEENT: Pupils equal, extraocular movements intact  Respiratory: Patient's speak in full sentences and does  not appear short of breath  Cardiovascular: No lower extremity edema, non tender, no erythema  Skin: Warm dry intact with no signs of infection or rash on extremities or on axial skeleton.  Abdomen: Soft nontender  Neuro: Cranial nerves II through XII are intact, neurovascularly intact in all extremities with 2+ DTRs and 2+ pulses.  Lymph: No lymphadenopathy of posterior or anterior cervical chain or axillae bilaterally.  Gait normal with good balance and coordination.  MSK:  Non tender with full range of motion and good stability and symmetric strength and tone of shoulders, elbows, wrist, hip, knee and ankles bilaterally.  Back Exam:  Inspection: Unremarkable  Motion: Flexion 45 deg, Extension 45 deg, Side Bending to 45 deg bilaterally,  Rotation to 45 deg bilaterally  SLR laying: Negative  XSLR laying: Negative  Palpable tenderness: None. FABER: negative. Sensory change: Gross sensation intact to all lumbar and sacral dermatomes.  Reflexes: 2+ at both patellar tendons, 2+ at achilles tendons, Babinski's downgoing.  Strength at foot  Plantar-flexion: 5/5 Dorsi-flexion: 5/5 Eversion: 5/5 Inversion: 5/5  Leg strength  Quad: 5/5 Hamstring: 5/5 Hip flexor: 5/5 Hip abductors: 5/5  Gait unremarkable. Patient actually does have severe tightness of the hamstrings bilaterally left greater than right. Pain is now more over the ischial and not as much on the distal aspect.   MSK US performed of: Left knee This study was ordered, performed, and interpreted by Terrilee Files D.O.  Knee: Left All structures visualized. Anteromedial, anterolateral, posteromedial, and posterolateral menisci unremarkable without tearing, fraying, effusion, or displacement. Patellar Tendon unremarkable on long and transverse views without effusion. No  abnormality of prepatellar bursa. LCL and MCL unremarkable on long and transverse views. No abnormality of origin of medial or lateral head of the  gastrocnemius. Ischial bursitis noted.   IMPRESSION: Ischial bursitis  Procedure: Real-time Ultrasound Guided Injection of left ischial bursa Device: GE Logiq E  Ultrasound guided injection is preferred based studies that show increased duration, increased effect, greater accuracy, decreased procedural pain, increased response rate, and decreased cost with ultrasound guided versus blind injection.  Verbal informed consent obtained.  Time-out conducted.  Noted no overlying erythema, induration, or other signs of local infection.  Skin prepped in a sterile fashion.  Local anesthesia: Topical Ethyl chloride.  With sterile technique and under real time ultrasound guidance:  With a 21-gauge 3 inch needle under ultrasound guidance patient was injected into the ischial bursa. Completed without difficulty  Pain immediately resolved suggesting accurate placement of the medication.  Advised to call if fevers/chills, erythema, induration, drainage, or persistent bleeding.  Images permanently stored and available for review in the ultrasound unit.  Impression: Technically successful ultrasound guided injection.   Impression and Recommendations:     This case required medical decision making of moderate complexity.

## 2014-09-06 NOTE — Patient Instructions (Signed)
Good ot see you That hamstring still gives you trouble.  Ice is your friend Tennis ball in back left pocket with sitting No soccer for 3 days.  New exercises 3 times a week.  See me again in 3 weeks if not  A lot better.

## 2014-09-08 ENCOUNTER — Other Ambulatory Visit: Payer: Self-pay | Admitting: Family Medicine

## 2014-09-08 NOTE — Telephone Encounter (Signed)
Refill done.  

## 2014-09-27 ENCOUNTER — Ambulatory Visit (INDEPENDENT_AMBULATORY_CARE_PROVIDER_SITE_OTHER): Payer: 59 | Admitting: Family Medicine

## 2014-09-27 ENCOUNTER — Encounter: Payer: Self-pay | Admitting: Family Medicine

## 2014-09-27 VITALS — BP 112/80 | HR 72 | Ht 63.0 in | Wt 156.0 lb

## 2014-09-27 DIAGNOSIS — S86812A Strain of other muscle(s) and tendon(s) at lower leg level, left leg, initial encounter: Secondary | ICD-10-CM

## 2014-09-27 DIAGNOSIS — M65852 Other synovitis and tenosynovitis, left thigh: Secondary | ICD-10-CM

## 2014-09-27 DIAGNOSIS — S86819A Strain of other muscle(s) and tendon(s) at lower leg level, unspecified leg, initial encounter: Secondary | ICD-10-CM | POA: Insufficient documentation

## 2014-09-27 DIAGNOSIS — M76892 Other specified enthesopathies of left lower limb, excluding foot: Secondary | ICD-10-CM

## 2014-09-27 NOTE — Patient Instructions (Signed)
You are doing better which is great!!! Ice is still your friend Get a compression sleeve at [employer] or another sporting good store.  New exercises 3-4 times a week.  Wear heel lift in right shoe and see if this helps.  See me again in 3-4 weeks.

## 2014-09-27 NOTE — Assessment & Plan Note (Signed)
Patient is doing remarkably better at this time. Patient will continue to do the exercises as well as a compression fairly regularly. Patient continues to do well we will not make any changes.

## 2014-09-27 NOTE — Progress Notes (Signed)
Tanner Robinson D.O. [facility] [address] [place], [place] [address] Phone: [phone] Subjective:    CC: Left leg pain  BJY:NWGNFAOZHYHPI:Subjective Tanner Kelby FamChantaleukay is a 53 y.o. male coming in with complaint of leg pain on the left side. Patient had hamstring tendinitis distally as well as ischial bursitis proximally. Patient at last visit was given in injection under ultrasound guidance in the ischial bursa. Patient encouraged to go to formal physical therapy which she declined and was only doing home exercises. Patient states that his upper leg is 100% better. Patient is having no pain. Continues to do the exercises and the compression on a regular basis. Patient is very happy with these results.  Patient was coming in with a new problem. States that now the pain seems to be localized in his calf muscle. Patient states that happens when he walks a long amount of distance. States that it is more of a cramping sensation. Denies any weakness. States that it is a dull nagging ache that can occur most days of the week. Denies any nighttime awakening. Does not stop him from any activity.  While under the care of Dr. Allena KatzPatel the patient was also given an EMG that showed mild left S1 radiculopathy.    X-rays are reviewed by me today. May 20, 2012 patient did have x-rays of the tibia and fibula which are negative. Patient also had back x-rays at the same time that were negative with no osteoarthritic changes.    Past medical history, social, surgical and family history all reviewed in electronic medical record.   Review of Systems: No headache, visual changes, nausea, vomiting, diarrhea, constipation, dizziness, abdominal pain, skin rash, fevers, chills, night sweats, weight loss, swollen lymph nodes, body aches, joint swelling, muscle aches, chest pain, shortness of breath, mood changes.   Objective Blood pressure 112/80, pulse 72, height 5\' 3"  (1.6 m), weight 156 lb (70.761 kg), SpO2  96.00%.  General: No apparent distress alert and oriented x3 mood and affect normal, dressed appropriately.  HEENT: Pupils equal, extraocular movements intact  Respiratory: Patient's speak in full sentences and does not appear short of breath  Cardiovascular: No lower extremity edema, non tender, no erythema  Skin: Warm dry intact with no signs of infection or rash on extremities or on axial skeleton.  Abdomen: Soft nontender  Neuro: Cranial nerves II through XII are intact, neurovascularly intact in all extremities with 2+ DTRs and 2+ pulses.  Lymph: No lymphadenopathy of posterior or anterior cervical chain or axillae bilaterally.  Gait normal with good balance and coordination.  MSK:  Non tender with full range of motion and good stability and symmetric strength and tone of shoulders, elbows, wrist, hip, knee and ankles bilaterally.  Back Exam:  Inspection: Unremarkable  Motion: Flexion 45 deg, Extension 45 deg, Side Bending to 45 deg bilaterally,  Rotation to 45 deg bilaterally  SLR laying: Negative  XSLR laying: Negative  Palpable tenderness: None. FABER: negative. Sensory change: Gross sensation intact to all lumbar and sacral dermatomes.  Reflexes: 2+ at both patellar tendons, 2+ at achilles tendons, Babinski's downgoing.  Strength at foot  Plantar-flexion: 5/5 Dorsi-flexion: 5/5 Eversion: 5/5 Inversion: 5/5  Leg strength  Quad: 5/5 Hamstring: 5/5 Hip flexor: 5/5 Hip abductors: 5/5  Gait unremarkable. Continued mild tightness of the hamstrings bilaterally left greater than right. Patient is minimally sore in compression of the calf compared to the contralateral side. No signs of swelling and no inflammation. No redness of the skin. Full  strength compared to contralateral side.      Impression and Recommendations:     This case required medical decision making of moderate complexity.

## 2014-09-27 NOTE — Assessment & Plan Note (Signed)
I believe with patients working on increasing range of motion of his hamstring he is using his calf considerably amount more. We discussed and icing protocol, compression, and given home exercises and showed proper technique. Patient was given a heel lift today which hopefully will help throughout the day. Differential does include a radicular symptoms and patient does have an EMG showing S1 radiculopathy. Patient continues to have difficulty or has worsening symptoms we may need to consider further imaging a patient lower back but I do not think that this is necessary. Patient will follow-up again in 3-4 weeks for further evaluation.  Spent greater than 25 minutes with patient face-to-face and had greater than 50% of counseling including as described above in assessment and plan.

## 2014-10-25 ENCOUNTER — Ambulatory Visit (INDEPENDENT_AMBULATORY_CARE_PROVIDER_SITE_OTHER): Payer: 59 | Admitting: Family Medicine

## 2014-10-25 ENCOUNTER — Ambulatory Visit (INDEPENDENT_AMBULATORY_CARE_PROVIDER_SITE_OTHER)
Admission: RE | Admit: 2014-10-25 | Discharge: 2014-10-25 | Disposition: A | Discharge status: Home / Self Care | Payer: 59 | Source: Ambulatory Visit | Attending: Family Medicine | Admitting: Family Medicine

## 2014-10-25 ENCOUNTER — Encounter: Payer: Self-pay | Admitting: Family Medicine

## 2014-10-25 VITALS — BP 122/80 | HR 71 | Ht 63.0 in | Wt 160.0 lb

## 2014-10-25 DIAGNOSIS — M5416 Radiculopathy, lumbar region: Secondary | ICD-10-CM

## 2014-10-25 NOTE — Assessment & Plan Note (Signed)
Patient has had this chronic pain for quite some time. Patient has not responded well to conservative therapy at this time. I do believe that further imaging is warranted. Patient's last x-rays were greater than 53 years old and we will repeat. Depending on findings I would consider an MRI secondary to patient having increasing weakness compared to previous exam. Discuss continuing conservative therapy at this time and really trying formal physical therapy which patient declined. Patient will continue with the home exercises and will come back and see me again 1-2 days after the MRI to further evaluate.  Spent greater than 25 minutes with patient face-to-face and had greater than 50% of counseling including as described above in assessment and plan.

## 2014-10-25 NOTE — Patient Instructions (Addendum)
Good to see you We will get xray today in the back Mri of the back as well.  They will call you to set it up Ice still and the compression sleeve Continue the exercises at least 3 times a week See me again 1-2 days after the MRI and we will go over it together Happy Thanksgiving.

## 2014-10-25 NOTE — Progress Notes (Signed)
Tanner ScaleZach Tanner Robinson D.O. [facility] [address] [place], [place] [address] Phone: [phone](336) [phone] Subjective:    CC: Left leg pain  OZH:YQMVHQIONGHPI:Subjective Tanner Kelby FamChantaleukay is a 53 y.o. male coming in with complaint of leg pain on the left side. Patient had hamstring tendinitis distally as well as ischial bursitis proximally. Patient at last visit was given in injection under ultrasound guidance in the ischial bursa.  Patient has been wearing a compression sleeve and a regular basis but states that he continues to have difficulty with working. Patient states as long as he wears the compression sleeve on the hamstring the pain in his calf is bearable. Patient states though that every day he continues to have the pain and now he has to sit down from time to time during work. Patient is taking gabapentin fairly regularly without any significant improvement. Still needs a hydrocodone for different breakthrough pain. Patient previously has responded well to a sural nerve injection. Patient states though he would like to not try that again consisted did not seem to keep the pain away entirely. Patient denies any significant back pain but feels that this leg may be getting weaker than prior other leg. Continues to have significant amount cramping.   While under the care of Dr. Allena KatzPatel the patient was also given an EMG that showed mild left S1 radiculopathy.    X-rays are reviewed by me today. May 20, 2012 patient did have x-rays of the tibia and fibula which are negative. Patient also had back x-rays at the same time that were negative with no osteoarthritic changes.    Past medical history, social, surgical and family history all reviewed in electronic medical record.   Review of Systems: No headache, visual changes, nausea, vomiting, diarrhea, constipation, dizziness, abdominal pain, skin rash, fevers, chills, night sweats, weight loss, swollen lymph nodes, body aches, joint swelling, muscle aches,  chest pain, shortness of breath, mood changes.   Objective Blood pressure 122/80, pulse 71, height 5\' 3"  (1.6 m), weight 160 lb (72.576 kg), SpO2 98 %.  General: No apparent distress alert and oriented x3 mood and affect normal, dressed appropriately.  HEENT: Pupils equal, extraocular movements intact  Respiratory: Patient's speak in full sentences and does not appear short of breath  Cardiovascular: No lower extremity edema, non tender, no erythema  Skin: Warm dry intact with no signs of infection or rash on extremities or on axial skeleton.  Abdomen: Soft nontender  Neuro: Cranial nerves II through XII are intact, neurovascularly intact in all extremities with 2+ DTRs and 2+ pulses.  Lymph: No lymphadenopathy of posterior or anterior cervical chain or axillae bilaterally.  Gait normal with good balance and coordination.  MSK:  Non tender with full range of motion and good stability and symmetric strength and tone of shoulders, elbows, wrist, hip, knee and ankles bilaterally.  Back Exam:  Inspection: Unremarkable  Motion: Flexion 45 deg, Extension 45 deg, Side Bending to 45 deg bilaterally,  Rotation to 45 deg bilaterally  SLR laying: Negative  XSLR laying: Negative  Palpable tenderness: None. FABER: negative. Sensory change: Gross sensation intact to all lumbar and sacral dermatomes.  Reflexes: 2+ at both patellar tendons, 2+ at achilles tendons, Babinski's downgoing.  Strength at foot  Plantar-flexion: 4/5 Dorsi-flexion: 5/5 Eversion: 4/5 Inversion: 5/5  Leg strength  Quad: 5/5 Hamstring: 5/5 Hip flexor: 5/5 Hip abductors: 4/5  Gait unremarkable. Continued mild tightness of the hamstrings bilaterally left greater than right. Patient is minimally sore in compression of  the calf compared to the contralateral side. No signs of swelling and no inflammation. No redness of the skin. Mild weakness with plantarflexion compared to the contralateral side.      Impression and  Recommendations:     This case required medical decision making of moderate complexity.

## 2014-11-09 ENCOUNTER — Ambulatory Visit
Admission: RE | Admit: 2014-11-09 | Discharge: 2014-11-09 | Disposition: A | Discharge status: Home / Self Care | Payer: 59 | Source: Ambulatory Visit | Attending: Family Medicine | Admitting: Family Medicine

## 2014-11-09 DIAGNOSIS — M5416 Radiculopathy, lumbar region: Secondary | ICD-10-CM

## 2014-11-24 ENCOUNTER — Encounter: Payer: Self-pay | Admitting: Family Medicine

## 2014-11-24 ENCOUNTER — Ambulatory Visit (INDEPENDENT_AMBULATORY_CARE_PROVIDER_SITE_OTHER): Payer: 59 | Admitting: Family Medicine

## 2014-11-24 VITALS — BP 116/78 | HR 76 | Ht 63.0 in | Wt 157.0 lb

## 2014-11-24 DIAGNOSIS — M5430 Sciatica, unspecified side: Secondary | ICD-10-CM

## 2014-11-24 DIAGNOSIS — M5416 Radiculopathy, lumbar region: Secondary | ICD-10-CM

## 2014-11-24 NOTE — Patient Instructions (Signed)
Good to see you Call the pharmacy for the refill Try the medicine I gave you 3 times a day for 5 days We will get you set up for an injection in your back.  After the injection come back 2 weeks later and we will see how you are doing.  Happy holidays!

## 2014-11-24 NOTE — Progress Notes (Signed)
Tanner Robinson D.O. [facility] [address] [place], [place] [address] Phone: [phone](336) [phone] Subjective:    CC: Left leg pain  BJY:NWGNFAOZHYHPI:Subjective Tanner Robinson is a 53 y.o. male coming in with complaint of leg pain on the left side. Patient had hamstring tendinitis distally as well as ischial bursitis proximally. Patient at last visit was given in injection under ultrasound guidance in the ischial bursa.  Patient has been wearing a compression sleeve and a regular basis but states that he continues to have difficulty with working. .   While under the care of Dr. Allena KatzPatel the patient was also given an EMG that showed mild left S1 radiculopathy.    Patient continued to have difficulty so MRI of the lumbar spine was ordered. MRI was reviewed by me. 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 Patient does have a left-sided central disc protrusion at L5-S1 but there is no specific nerve root impingement on S1.  X-rays are reviewed by me today. May 20, 2012 patient did have x-rays of the tibia and fibula which are negative. Patient also had back x-rays at the same time that were negative with no osteoarthritic changes.  Patient states  he continues to have difficulty. Patient has not notice any significant more weakness than we seen previously. Patient continues with the topical anti-inflammatory but does not feel that it is strong enough. Patient states the gabapentin has not been helpful and has stopped this medication.    Past medical history, social, surgical and family history all reviewed in electronic medical record.   Review of Systems: No headache, visual changes, nausea, vomiting, diarrhea, constipation, dizziness, abdominal pain, skin rash, fevers, chills, night sweats, weight loss, swollen lymph nodes, body aches, joint swelling, muscle aches, chest pain, shortness of breath, mood changes.   Objective Blood pressure 116/78, pulse 76, height 5\' 3"  (1.6 m),  weight 157 lb (71.215 kg), SpO2 98 %.  General: No apparent distress alert and oriented x3 mood and affect normal, dressed appropriately.  HEENT: Pupils equal, extraocular movements intact  Respiratory: Patient's speak in full sentences and does not appear short of breath  Cardiovascular: No lower extremity edema, non tender, no erythema  Skin: Warm dry intact with no signs of infection or rash on extremities or on axial skeleton.  Abdomen: Soft nontender  Neuro: Cranial nerves II through XII are intact, neurovascularly intact in all extremities with 2+ DTRs and 2+ pulses.  Lymph: No lymphadenopathy of posterior or anterior cervical chain or axillae bilaterally.  Gait normal with good balance and coordination.  MSK:  Non tender with full range of motion and good stability and symmetric strength and tone of shoulders, elbows, wrist, hip, knee and ankles bilaterally.  Back Exam:  Inspection: Unremarkable  Motion: Flexion 45 deg, Extension 45 deg, Side Bending to 45 deg bilaterally,  Rotation to 45 deg bilaterally  SLR laying: Moderately positive today on the left side XSLR laying: Negative  Palpable tenderness: None. FABER: negative. Sensory change: Gross sensation intact to all lumbar and sacral dermatomes.  Reflexes: 2+ at both patellar tendons, 2+ at achilles tendons, Babinski's downgoing.  Strength at foot  Plantar-flexion: 4/5 Dorsi-flexion: 5/5 Eversion: 4/5 Inversion: 5/5  Leg strength  Quad: 5/5 Hamstring: 5/5 Hip flexor: 5/5 Hip abductors: 4/5  Gait unremarkable. Continued mild tightness of the hamstrings bilaterally left greater than right. Patient is minimally sore in compression of the calf compared to the contralateral side. No signs of swelling and no inflammation. No redness  of the skin. Mild weakness with plantarflexion compared to the contralateral side. Minimal change from previous exam     Impression and Recommendations:     This case required medical decision  making of moderate complexity.

## 2014-11-24 NOTE — Assessment & Plan Note (Signed)
Discussed with patient at length. Patient has not made any significant improvement even though we have tried multiple different treatment options. Patient's MRI does not show specific nerve root impingement with the findings at the S1 level as well as an EMG showing mild neuropathy I do think that an epidural steroid injection could be beneficial. I think that this could be hopefully therapeutic as well as diagnostic. Patient will continue with all other conservative therapy at this time. Patient still has pain medications if he needs it. She was given a trial of some other oral anti-inflammatories to see if this will be helpful. Discussed icing protocol. If patient continues to have difficulty even after the epidural steroid injection he will come back for further evaluation. At that time I will need to consider more of an S1 nerve root impingement near the piriformis muscle. Ultrasound as well as an ultrasound guided injection could be helpful for this. We will discuss further at follow-up.  Spent greater than 25 minutes with patient face-to-face and had greater than 50% of counseling including as described above in assessment and plan.

## 2014-11-30 ENCOUNTER — Other Ambulatory Visit: Payer: Self-pay | Admitting: Family Medicine

## 2014-12-01 NOTE — Telephone Encounter (Signed)
Refill done.  

## 2014-12-21 ENCOUNTER — Ambulatory Visit
Admission: RE | Admit: 2014-12-21 | Discharge: 2014-12-21 | Disposition: A | Discharge status: Home / Self Care | Payer: 59 | Source: Ambulatory Visit | Attending: Family Medicine | Admitting: Family Medicine

## 2014-12-21 DIAGNOSIS — M5430 Sciatica, unspecified side: Secondary | ICD-10-CM

## 2014-12-21 MED ORDER — METHYLPREDNISOLONE ACETATE 40 MG/ML INJ SUSP (RADIOLOG
120.0000 mg | Freq: Once | INTRAMUSCULAR | Status: AC
  Administered 2014-12-21: 120 mg via EPIDURAL

## 2014-12-21 MED ORDER — IOHEXOL 180 MG/ML  SOLN
1.0000 mL | Freq: Once | INTRAMUSCULAR | Status: AC | PRN
  Administered 2014-12-21: 1 mL via EPIDURAL

## 2014-12-21 NOTE — Discharge Instructions (Signed)

## 2016-04-28 ENCOUNTER — Ambulatory Visit (INDEPENDENT_AMBULATORY_CARE_PROVIDER_SITE_OTHER): Payer: 59 | Admitting: Family Medicine

## 2016-04-28 VITALS — BP 130/80 | HR 69 | Temp 98.2°F | Resp 17 | Ht 62.5 in | Wt 159.1 lb

## 2016-04-28 DIAGNOSIS — H1011 Acute atopic conjunctivitis, right eye: Secondary | ICD-10-CM

## 2016-04-28 DIAGNOSIS — H60391 Other infective otitis externa, right ear: Secondary | ICD-10-CM

## 2016-04-28 DIAGNOSIS — R21 Rash and other nonspecific skin eruption: Secondary | ICD-10-CM

## 2016-04-28 MED ORDER — AZELASTINE HCL 0.05 % OP SOLN: 1.0000 [drp] | Freq: Two times a day (BID) | OPHTHALMIC | Status: AC

## 2016-04-28 MED ORDER — OFLOXACIN 0.3 % OT SOLN: 10.0000 [drp] | Freq: Every day | OTIC | Status: AC

## 2016-04-28 MED ORDER — TRIAMCINOLONE ACETONIDE 0.1 % EX CREA: 1.0000 "application " | TOPICAL_CREAM | Freq: Two times a day (BID) | CUTANEOUS | Status: AC

## 2016-04-28 NOTE — Patient Instructions (Addendum)
IF you received an x-ray today, you will receive an invoice from [facility]. Please contact [facility] at [phone] with questions or concerns regarding your invoice.   IF you received labwork today, you will receive an invoice from [employer]. Please contact [facility] at [phone] with questions or concerns regarding your invoice.   Our billing staff will not be able to assist you with questions regarding bills from these companies.  You will be contacted with the lab results as soon as they are available. The fastest way to get your results is to activate your My Chart account. Instructions are located on the last page of this paperwork. If you have not heard from [place] regarding the results in 2 weeks, please contact this office.    For your ear, use the eardrops as instructed for possible external ear infection or infection from your middle ear that I am unable to see today. If the symptoms are not improved in the next week to 10 days, or any worsening sooner, return for recheck as you may need to see ear nose and throat.  Okay to use over-the-counter Systane or Blink eyedrops, but I also sent in Optivar drops for possible allergy symptoms of your eye. If this is not improving, let me know and I can refer you to an eye care provider.  Steroid cream to the itching areas on your back as needed, but if this persists, recommend recheck here or your primary care provider.  Return to the clinic or go to the nearest emergency room if any of your symptoms worsen or new symptoms occur.  Otitis Externa Otitis externa is a bacterial or fungal infection of the outer ear canal. This is the area from the eardrum to the outside of the ear. Otitis externa is sometimes called "swimmer's ear." CAUSES  Possible causes of infection include:  Swimming in dirty water.  Moisture remaining in the ear after swimming or bathing.  Mild injury (trauma) to  the ear.  Objects stuck in the ear (foreign body).  Cuts or scrapes (abrasions) on the outside of the ear. SIGNS AND SYMPTOMS  The first symptom of infection is often itching in the ear canal. Later signs and symptoms may include swelling and redness of the ear canal, ear pain, and yellowish-white fluid (pus) coming from the ear. The ear pain may be worse when pulling on the earlobe. DIAGNOSIS  Your health care provider will perform a physical exam. A sample of fluid may be taken from the ear and examined for bacteria or fungi. TREATMENT  Antibiotic ear drops are often given for 10 to 14 days. Treatment may also include pain medicine or corticosteroids to reduce itching and swelling. HOME CARE INSTRUCTIONS   Apply antibiotic ear drops to the ear canal as prescribed by your health care provider.  Take medicines only as directed by your health care provider.  If you have diabetes, follow any additional treatment instructions from your health care provider.  Keep all follow-up visits as directed by your health care provider. PREVENTION   Keep your ear dry. Use the corner of a towel to absorb water out of the ear canal after swimming or bathing.  Avoid scratching or putting objects inside your ear. This can damage the ear canal or remove the protective wax that lines the canal. This makes it easier for bacteria and fungi to grow.  Avoid swimming in lakes, polluted water, or poorly chlorinated pools.  You may use  ear drops made of rubbing alcohol and vinegar after swimming. Combine equal parts of white vinegar and alcohol in a bottle. Put 3 or 4 drops into each ear after swimming. SEEK MEDICAL CARE IF:   You have a fever.  Your ear is still red, swollen, painful, or draining pus after 3 days.  Your redness, swelling, or pain gets worse.  You have a severe headache.  You have redness, swelling, pain, or tenderness in the area behind your ear. MAKE SURE YOU:   Understand these  instructions.  Will watch your condition.  Will get help right away if you are not doing well or get worse.   This information is not intended to replace advice given to you by your health care provider. Make sure you discuss any questions you have with your health care provider.   Document Released: 11/19/2005 Document Revised: 12/10/2014 Document Reviewed: 12/06/2011 Elsevier Interactive Patient Education 2016 [employer]  Allergic Conjunctivitis Allergic conjunctivitis is inflammation of the clear membrane that covers the white part of your eye and the inner surface of your eyelid (conjunctiva), and it is caused by allergies. The blood vessels in the conjunctiva become inflamed, and this causes the eye to become red or pink, and it often causes itchiness in the eye. Allergic conjunctivitis cannot be spread by one person to another person (noncontagious). CAUSES This condition is caused by an allergic reaction. Common causes of an allergic reaction (allergens) include:  Dust.  Pollen.  Mold.  Animal dander or secretions. RISK FACTORS This condition is more likely to develop if you are exposed to high levels of allergens that cause the allergic reaction. This might include being outdoors when air pollen levels are high or being around animals that you are allergic to. SYMPTOMS Symptoms of this condition may include:  Eye redness.  Tearing of the eyes.  Watery eyes.  Itchy eyes.  Burning feeling in the eyes.  Clear drainage from the eyes.  Swollen eyelids. DIAGNOSIS This condition may be diagnosed by medical history and physical exam. If you have drainage from your eyes, it may be tested to rule out other causes of conjunctivitis. TREATMENT Treatment for this condition often includes medicines. These may be eye drops, ointments, or oral medicines. They may be prescription medicines or over-the-counter medicines. HOME CARE INSTRUCTIONS  Take or apply medicines only as  directed by your health care provider.  Do not touch or rub your eyes.  Do not wear contact lenses until the inflammation is gone. Wear glasses instead.  Do not wear eye makeup until the inflammation is gone.  Apply a cool, clean washcloth to your eye for 10-20 minutes, 3-4 times a day.  Try to avoid whatever allergen is causing the allergic reaction. SEEK MEDICAL CARE IF:  Your symptoms get worse.  You have pus draining from your eye.  You have new symptoms.  You have a fever.   This information is not intended to replace advice given to you by your health care provider. Make sure you discuss any questions you have with your health care provider.   Document Released: 02/09/2003 Document Revised: 12/10/2014 Document Reviewed: 08/31/2014 Elsevier Interactive Patient Education [employer]2016 Elsevier Inc.

## 2016-04-28 NOTE — Progress Notes (Signed)
By signing my name below I, Tanner Robinson, attest that this documentation has been prepared under the direction and in the presence of [name], MD. Electonically Signed. Tanner Robinson, Scribe 04/28/2016 at 1:00 PM  Subjective:    Patient ID: Tanner Robinson, male    DOB: 03/11/1961, 55 y.o.   MRN: [id number]  Chief Complaint  Patient presents with  . Ear Fullness    feels like ear draining also-right ear x 2 weeks  . Itchy Eye    right eye since last night  . Rash    Back and neck    HPI Tanner Robinson is a 55 y.o. male who presents to the [facility] complaining of rt ear drainage for the past 2 weeks. Rt ear has a fullness sensation and decreased hearing. Pt denies any recent swimming. Pt has been sneezing.   Pt also c/o rt eye itchiness since last night. Symptoms started after cleaning up at work yesterday. Pt denies any vision problems. Pt reports allergies. Pt takes claritin. Pt has been using an eye drop for dry eyes with minimal relief. Pt wears glasses. Pt does not use contacts.  Pt has a rash on his back and neck. Pt has had rash for a while and has been evaluated and told it was related to food.     Patient Active Problem List   Diagnosis Date Noted  . Lumbar radiculopathy 10/25/2014  . Strain of calf muscle 09/27/2014  . Ischial bursitis of left side 09/06/2014  . 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 Hamstring tendinitis of left thigh 07/02/2014   Past Medical History  Diagnosis Date  . Urinary frequency   . Numbness and tingling of both legs   . Unspecified vitamin D deficiency   . Pain in limb   . Disturbance of skin sensation   . Polyneuropathy in other diseases classified elsewhere (HCC)   . Iron deficiency anemia, unspecified   . Other testicular hypofunction   . Allergy, unspecified not elsewhere classified    Past Surgical History  Procedure Laterality Date  . Appendectomy     No Known  Allergies Prior to Admission medications   Medication Sig Start Date End Date Taking? Authorizing Provider  loratadine (CLARITIN) 10 MG tablet Take 10 mg by mouth daily.   Yes Historical Provider, MD  cephALEXin (KEFLEX) 500 MG capsule Take 1 capsule (500 mg total) by mouth 4 (four) times daily. Patient not taking: Reported on 04/28/2016 04/20/14   Elson Areas, PA-C  Cholecalciferol (VITAMIN D3) 2000 UNITS TABS Take 1 tablet by mouth daily. Reported on 04/28/2016    Historical Provider, MD  gabapentin (NEURONTIN) 300 MG capsule Take 300 mg by mouth 3 (three) times daily. Reported on 04/28/2016    Historical Provider, MD  HYDROcodone-acetaminophen (NORCO/VICODIN) 5-325 MG per tablet Take 2 tablets by mouth every 4 (four) hours as needed. Patient not taking: Reported on 04/28/2016 04/20/14   Elson Areas, PA-C  Multiple Vitamin (MULTIVITAMIN) tablet Take 1 tablet by mouth daily. Reported on 04/28/2016    Historical Provider, MD  PENNSAID 2 % SOLN APPLY PUMPS TOPICALLY TWO TIMES A DAY Patient not taking: Reported on 04/28/2016 12/01/14   Judi Saa, DO  testosterone Cathren Laine) 4 MG/24HR PT24 patch Place 1 patch onto the skin daily. Reported on 04/28/2016    Historical Provider, MD   Social History   Social History  . Marital Status: Married    Spouse Name: N/A  .  Number of Children: N/A  . Years of Education: N/A   Occupational History  . Not on file.   Social History Main Topics  . Smoking status: Former Smoker -- 1.00 packs/day for 20 years    Types: Cigarettes    Quit date: 12/04/1999  . Smokeless tobacco: Never Used  . Alcohol Use: No  . Drug Use: No  . Sexual Activity: Not on file   Other Topics Concern  . Not on file   Social History Narrative   He works as a Manufacturing systems engineermachine operator   Highest level of education:  High school   He lives with wife and they have two children.      Review of Systems  HENT: Positive for congestion and ear discharge.   Eyes: Positive for  itching. Negative for visual disturbance.  Skin: Positive for rash.       Objective:   Physical Exam  HENT:  Right Ear: There is drainage (yellow discharge).  Left Ear: Tympanic membrane and ear canal normal.  Pt has swelling and redness in rt ear canal, cannot view TM due to drainage.  Eyes: EOM are normal. Pupils are equal, round, and reactive to light. Right eye exhibits no discharge. Left eye exhibits no discharge. Left conjunctiva is injected (minimal injection of sclera).  Pt has upper and lower lid edema of the rt eye. Fundoscopic exam: 2 drops proparacaine applied, lids everted, no FB, no sty. Fluorescence applied: no scratches, no FB seen.   Lymphadenopathy:       Head (right side): No preauricular adenopathy present.       Head (left side): No preauricular adenopathy present.  Skin:  Pt has a few excoriated areas along lower rt paralumbar region. No other rash across back or neck.      Filed Vitals:   04/28/16 1123  BP: 130/80  Pulse: 69  Temp: 98.2 F (36.8 C)  TempSrc: Oral  Resp: 17  Height: 5' 2.5" (1.588 m)  Weight: 159 lb 2 oz (72.179 kg)  SpO2: 98%        Assessment & Plan:   Tanner [name] is a 55 y.o. male Allergic conjunctivitis, right - Plan: azelastine (OPTIVAR) 0.05 % ophthalmic solution  -No sign of abrasion or foreign body on exam, noted after dust exposure. Stay. May have component of allergic conjunctivitis versus residual irritation from previous days dust exposure. Can try Systane or blink eyedrops, Optivar twice a day as needed for allergic conjunctivae, RTC precautions if not improving, and can refer to Optho if needed.   Otitis, externa, infective, right - Plan: ofloxacin (FLOXIN OTIC) 0.3 % otic solution  - Otitis externa versus chronic medially with rupture. Unable to visualize TM, but will initially start with Floxin Otic drops. 10 drops daily, then if not improving within the week to 10 days, return for recheck or refer to ENT  for further evaluation. Sooner if worse.  Rash of back - Plan: triamcinolone cream (KENALOG) 0.1 %  -Atopic dermatitis versus dry skin. Trial of Kenalog topically twice a day when necessary, return for further evaluation if persistent.   Meds ordered this encounter  Medications  . loratadine (CLARITIN) 10 MG tablet    Sig: Take 10 mg by mouth daily.  Marland Kitchen. triamcinolone cream (KENALOG) 0.1 %    Sig: Apply 1 application topically 2 (two) times daily.    Dispense:  30 g    Refill:  0  . ofloxacin (FLOXIN OTIC) 0.3 % otic solution  Sig: Place 10 drops into the right ear daily.    Dispense:  10 mL    Refill:  0  . azelastine (OPTIVAR) 0.05 % ophthalmic solution    Sig: Place 1 drop into the right eye 2 (two) times daily.    Dispense:  6 mL    Refill:  1   Patient Instructions       IF you received an x-ray today, you will receive an invoice from [facility]. Please contact [facility] at [phone] with questions or concerns regarding your invoice.   IF you received labwork today, you will receive an invoice from [employer]. Please contact [facility] at [phone] with questions or concerns regarding your invoice.   Our billing staff will not be able to assist you with questions regarding bills from these companies.  You will be contacted with the lab results as soon as they are available. The fastest way to get your results is to activate your My Chart account. Instructions are located on the last page of this paperwork. If you have not heard from [place] regarding the results in 2 weeks, please contact this office.    For your ear, use the eardrops as instructed for possible external ear infection or infection from your middle ear that I am unable to see today. If the symptoms are not improved in the next week to 10 days, or any worsening sooner, return for recheck as you may need to see ear nose and throat.  Okay to use over-the-counter  Systane or Blink eyedrops, but I also sent in Optivar drops for possible allergy symptoms of your eye. If this is not improving, let me know and I can refer you to an eye care provider.  Steroid cream to the itching areas on your back as needed, but if this persists, recommend recheck here or your primary care provider.  Return to the clinic or go to the nearest emergency room if any of your symptoms worsen or new symptoms occur.  Otitis Externa Otitis externa is a bacterial or fungal infection of the outer ear canal. This is the area from the eardrum to the outside of the ear. Otitis externa is sometimes called "swimmer's ear." CAUSES  Possible causes of infection include:  Swimming in dirty water.  Moisture remaining in the ear after swimming or bathing.  Mild injury (trauma) to the ear.  Objects stuck in the ear (foreign body).  Cuts or scrapes (abrasions) on the outside of the ear. SIGNS AND SYMPTOMS  The first symptom of infection is often itching in the ear canal. Later signs and symptoms may include swelling and redness of the ear canal, ear pain, and yellowish-white fluid (pus) coming from the ear. The ear pain may be worse when pulling on the earlobe. DIAGNOSIS  Your health care provider will perform a physical exam. A sample of fluid may be taken from the ear and examined for bacteria or fungi. TREATMENT  Antibiotic ear drops are often given for 10 to 14 days. Treatment may also include pain medicine or corticosteroids to reduce itching and swelling. HOME CARE INSTRUCTIONS   Apply antibiotic ear drops to the ear canal as prescribed by your health care provider.  Take medicines only as directed by your health care provider.  If you have diabetes, follow any additional treatment instructions from your health care provider.  Keep all follow-up visits as directed by your health care provider. PREVENTION   Keep your ear dry. Use the  corner of a towel to absorb water out of  the ear canal after swimming or bathing.  Avoid scratching or putting objects inside your ear. This can damage the ear canal or remove the protective wax that lines the canal. This makes it easier for bacteria and fungi to grow.  Avoid swimming in lakes, polluted water, or poorly chlorinated pools.  You may use ear drops made of rubbing alcohol and vinegar after swimming. Combine equal parts of white vinegar and alcohol in a bottle. Put 3 or 4 drops into each ear after swimming. SEEK MEDICAL CARE IF:   You have a fever.  Your ear is still red, swollen, painful, or draining pus after 3 days.  Your redness, swelling, or pain gets worse.  You have a severe headache.  You have redness, swelling, pain, or tenderness in the area behind your ear. MAKE SURE YOU:   Understand these instructions.  Will watch your condition.  Will get help right away if you are not doing well or get worse.   This information is not intended to replace advice given to you by your health care provider. Make sure you discuss any questions you have with your health care provider.   Document Released: 11/19/2005 Document Revised: 12/10/2014 Document Reviewed: 12/06/2011 Elsevier Interactive Patient Education 2016 [employer].  Allergic Conjunctivitis Allergic conjunctivitis is inflammation of the clear membrane that covers the white part of your eye and the inner surface of your eyelid (conjunctiva), and it is caused by allergies. The blood vessels in the conjunctiva become inflamed, and this causes the eye to become red or pink, and it often causes itchiness in the eye. Allergic conjunctivitis cannot be spread by one person to another person (noncontagious). CAUSES This condition is caused by an allergic reaction. Common causes of an allergic reaction (allergens) include:  Dust.  Pollen.  Mold.  Animal dander or secretions. RISK FACTORS This condition is more likely to develop if you are exposed to  high levels of allergens that cause the allergic reaction. This might include being outdoors when air pollen levels are high or being around animals that you are allergic to. SYMPTOMS Symptoms of this condition may include:  Eye redness.  Tearing of the eyes.  Watery eyes.  Itchy eyes.  Burning feeling in the eyes.  Clear drainage from the eyes.  Swollen eyelids. DIAGNOSIS This condition may be diagnosed by medical history and physical exam. If you have drainage from your eyes, it may be tested to rule out other causes of conjunctivitis. TREATMENT Treatment for this condition often includes medicines. These may be eye drops, ointments, or oral medicines. They may be prescription medicines or over-the-counter medicines. HOME CARE INSTRUCTIONS  Take or apply medicines only as directed by your health care provider.  Do not touch or rub your eyes.  Do not wear contact lenses until the inflammation is gone. Wear glasses instead.  Do not wear eye makeup until the inflammation is gone.  Apply a cool, clean washcloth to your eye for 10-20 minutes, 3-4 times a day.  Try to avoid whatever allergen is causing the allergic reaction. SEEK MEDICAL CARE IF:  Your symptoms get worse.  You have pus draining from your eye.  You have new symptoms.  You have a fever.   This information is not intended to replace advice given to you by your health care provider. Make sure you discuss any questions you have with your health care provider.   Document Released: 02/09/2003 Document  Revised: 12/10/2014 Document Reviewed: 08/31/2014 Elsevier Interactive Patient Education [employer]     I personally performed the services described in this documentation, which was scribed in my presence. The recorded information has been reviewed and considered, and addended by me as needed.

## 2016-07-21 ENCOUNTER — Emergency Department (HOSPITAL_COMMUNITY)
Admission: EM | Admit: 2016-07-21 | Discharge: 2016-07-21 | Disposition: A | Discharge status: Home / Self Care | Payer: 59 | Attending: Emergency Medicine | Admitting: Emergency Medicine

## 2016-07-21 ENCOUNTER — Encounter (HOSPITAL_COMMUNITY): Payer: Self-pay

## 2016-07-21 DIAGNOSIS — R1013 Epigastric pain: Secondary | ICD-10-CM | POA: Insufficient documentation

## 2016-07-21 DIAGNOSIS — Z87891 Personal history of nicotine dependence: Secondary | ICD-10-CM | POA: Diagnosis not present

## 2016-07-21 DIAGNOSIS — Z79899 Other long term (current) drug therapy: Secondary | ICD-10-CM | POA: Insufficient documentation

## 2016-07-21 DIAGNOSIS — R1084 Generalized abdominal pain: Secondary | ICD-10-CM | POA: Diagnosis present

## 2016-07-21 LAB — COMPREHENSIVE METABOLIC PANEL
ALT: 24 U/L (ref 17–63)
AST: 22 U/L (ref 15–41)
Albumin: 4.3 g/dL (ref 3.5–5.0)
Alkaline Phosphatase: 45 U/L (ref 38–126)
Anion gap: 7 (ref 5–15)
BUN: 13 mg/dL (ref 6–20)
CHLORIDE: 103 mmol/L (ref 101–111)
CO2: 27 mmol/L (ref 22–32)
CREATININE: 0.65 mg/dL (ref 0.61–1.24)
Calcium: 9.3 mg/dL (ref 8.9–10.3)
GFR calc Af Amer: >60 mL/min (ref >60)
GFR calc non Af Amer: >60 mL/min (ref >60)
GLUCOSE: 105 mg/dL — AB (ref 65–99)
Potassium: 3.8 mmol/L (ref 3.5–5.1)
SODIUM: 137 mmol/L (ref 135–145)
Total Bilirubin: 0.7 mg/dL (ref 0.3–1.2)
Total Protein: 7.3 g/dL (ref 6.5–8.1)

## 2016-07-21 LAB — LIPASE, BLOOD: Lipase: 22 U/L (ref 11–51)

## 2016-07-21 LAB — URINALYSIS, ROUTINE W REFLEX MICROSCOPIC
Bilirubin Urine: NEGATIVE
Glucose, UA: NEGATIVE mg/dL
HGB URINE DIPSTICK: NEGATIVE
Ketones, ur: NEGATIVE mg/dL
Leukocytes, UA: NEGATIVE
Nitrite: NEGATIVE
PROTEIN: NEGATIVE mg/dL
Specific Gravity, Urine: 1.037 — ABNORMAL HIGH (ref 1.005–1.030)
pH: 5.5 (ref 5.0–8.0)

## 2016-07-21 LAB — CBC
HCT: 35.9 % — ABNORMAL LOW (ref 39.0–52.0)
Hemoglobin: 12 g/dL — ABNORMAL LOW (ref 13.0–17.0)
MCH: 23.1 pg — AB (ref 26.0–34.0)
MCHC: 33.4 g/dL (ref 30.0–36.0)
MCV: 69.2 fL — AB (ref 78.0–100.0)
PLATELETS: 210 10*3/uL (ref 150–400)
RBC: 5.19 MIL/uL (ref 4.22–5.81)
RDW: 14.9 % (ref 11.5–15.5)
WBC: 7.7 10*3/uL (ref 4.0–10.5)

## 2016-07-21 MED ORDER — GI COCKTAIL ~~LOC~~
30.0000 mL | Freq: Once | ORAL | Status: AC
  Administered 2016-07-21: 30 mL via ORAL
  Filled 2016-07-21: qty 30

## 2016-07-21 NOTE — Discharge Instructions (Signed)
Try zantac 150mg twice a day.  ° ° °

## 2016-07-21 NOTE — ED Triage Notes (Signed)
Onset this morning abd pain "all over" and gas.  Had BM when pt arrived to ED, small hard stool.  No other s/s noted.

## 2016-07-21 NOTE — ED Notes (Signed)
Pt and family understood dc material. NAD noted 

## 2016-07-21 NOTE — ED Provider Notes (Signed)
[facility] Provider Note   CSN: [id number] Arrival date & time: 07/21/16  1726     History   Chief Complaint Chief Complaint  Patient presents with  . Abdominal Pain    HPI Tanner Robinson is a 55 y.o. male.  55 yo M with a chief complaint of diffuse abdominal pain. The started yesterday after having a carbonated drink. Patient denies fevers or chills denies vomiting but has had some mild nausea. Denies diarrhea or constipation. Pain is diffuse and colicky. Denies radiation. Tried some anti-gas medication without improvement. Has a history of a appendectomy.   The history is provided by the patient and the spouse.  Abdominal Pain   This is a new problem. The current episode started yesterday. The problem occurs constantly. The problem has not changed since onset.The pain is associated with eating. The pain is located in the epigastric region. The pain is at a severity of 5/10. The pain is moderate. Associated symptoms include nausea. Pertinent negatives include fever, diarrhea, vomiting, headaches, arthralgias and myalgias. Nothing aggravates the symptoms. Nothing relieves the symptoms.    Past Medical History:  Diagnosis Date  . Allergy, unspecified not elsewhere classified   . Disturbance of skin sensation   . Iron deficiency anemia, unspecified   . Numbness and tingling of both legs   . Other testicular hypofunction   . Pain in limb   . Polyneuropathy in other diseases classified elsewhere (HCC)   . Unspecified vitamin D deficiency   . Urinary frequency     Patient Active Problem List   Diagnosis Date Noted  . Lumbar radiculopathy 10/25/2014  . Strain of calf muscle 09/27/2014  . Ischial bursitis of left side 09/06/2014  . 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 Hamstring tendinitis of left thigh 07/02/2014    Past Surgical History:  Procedure Laterality Date  . APPENDECTOMY         Home Medications    Prior to Admission medications     Medication Sig Start Date End Date Taking? Authorizing Provider  azelastine (OPTIVAR) 0.05 % ophthalmic solution Place 1 drop into the right eye 2 (two) times daily. 04/28/16   Shade FloodJeffrey R Greene, MD  Cholecalciferol (VITAMIN D3) 2000 UNITS TABS Take 1 tablet by mouth daily. Reported on 04/28/2016    Historical Provider, MD  gabapentin (NEURONTIN) 300 MG capsule Take 300 mg by mouth 3 (three) times daily. Reported on 04/28/2016    Historical Provider, MD  loratadine (CLARITIN) 10 MG tablet Take 10 mg by mouth daily.    Historical Provider, MD  Multiple Vitamin (MULTIVITAMIN) tablet Take 1 tablet by mouth daily. Reported on 04/28/2016    Historical Provider, MD  ofloxacin (FLOXIN OTIC) 0.3 % otic solution Place 10 drops into the right ear daily. 04/28/16   Shade FloodJeffrey R Greene, MD  testosterone Cathren Laine(ANDRODERM) 4 MG/24HR PT24 patch Place 1 patch onto the skin daily. Reported on 04/28/2016    Historical Provider, MD  triamcinolone cream (KENALOG) 0.1 % Apply 1 application topically 2 (two) times daily. 04/28/16   Shade FloodJeffrey R Greene, MD    Family History Family History  Problem Relation Age of Onset  . Liver cancer Father     Deceased, 7561  . Other Mother     Unknown  . Healthy Son   . Healthy Daughter     Social History Social History  Substance Use Topics  . Smoking status: Former Smoker    Packs/day: 1.00    Years: 20.00  Types: Cigarettes    Quit date: 12/04/1999  . Smokeless tobacco: Never Used  . Alcohol use Yes     Comment: occ      Allergies   Review of patient's allergies indicates no known allergies.   Review of Systems Review of Systems  Constitutional: Negative for chills and fever.  HENT: Negative for congestion and facial swelling.   Eyes: Negative for discharge and visual disturbance.  Respiratory: Negative for shortness of breath.   Cardiovascular: Negative for chest pain and palpitations.  Gastrointestinal: Positive for abdominal pain and nausea. Negative for diarrhea and  vomiting.  Musculoskeletal: Negative for arthralgias and myalgias.  Skin: Negative for color change and rash.  Neurological: Negative for tremors, syncope and headaches.  Psychiatric/Behavioral: Negative for confusion and dysphoric mood.     Physical Exam Updated Vital Signs BP 129/78   Pulse (!) 54   Temp 97.7 F (36.5 C) (Oral)   Resp 18   Ht 5\' 3"  (1.6 m)   Wt 159 lb 1.6 oz (72.2 kg)   SpO2 100%   BMI 28.18 kg/m   Physical Exam  Constitutional: He is oriented to person, place, and time. He appears well-developed and well-nourished.  HENT:  Head: Normocephalic and atraumatic.  Eyes: Conjunctivae and EOM are normal. Pupils are equal, round, and reactive to light.  Neck: Normal range of motion. No JVD present.  Cardiovascular: Normal rate and regular rhythm.   Pulmonary/Chest: Effort normal. No stridor. No respiratory distress.  Abdominal: He exhibits no distension. There is tenderness (diffusely but worse in the epigastrium, negative murphys). There is no guarding.  Musculoskeletal: Normal range of motion. He exhibits no edema.  Neurological: He is alert and oriented to person, place, and time.  Skin: Skin is warm and dry.  Psychiatric: He has a normal mood and affect. His behavior is normal.     ED Treatments / Results  Labs (all labs ordered are listed, but only abnormal results are displayed) Labs Reviewed  COMPREHENSIVE METABOLIC PANEL - Abnormal; Notable for the following:       Result Value   Glucose, Bld 105 (*)    All other components within normal limits  CBC - Abnormal; Notable for the following:    Hemoglobin 12.0 (*)    HCT 35.9 (*)    MCV 69.2 (*)    MCH 23.1 (*)    All other components within normal limits  URINALYSIS, ROUTINE W REFLEX MICROSCOPIC (NOT AT [facility]) - Abnormal; Notable for the following:    Color, Urine AMBER (*)    Specific Gravity, Urine 1.037 (*)    All other components within normal limits  LIPASE, BLOOD    EKG  EKG  Interpretation None       Radiology No results found.  Procedures Procedures (including critical care time)  Medications Ordered in ED Medications  gi cocktail (Maalox,Lidocaine,Donnatal) (30 mLs Oral Given 07/21/16 2128)     Initial Impression / Assessment and Plan / ED Course  I have reviewed the triage vital signs and the nursing notes.  Pertinent labs & imaging results that were available during my care of the patient were reviewed by me and considered in my medical decision making (see chart for details).  Clinical Course    55 yo M With a chief complaint of diffuse abdominal pain. Patient has no focal abdominal tenderness. Labs are reassuring. Able to tolerate by mouth without difficulty. Do not feel that imaging is warranted at this time. PCP follow-up.  11:28  PM:  I have discussed the diagnosis/risks/treatment options with the patient and family and believe the pt to be eligible for discharge home to follow-up with PCP. We also discussed returning to the ED immediately if new or worsening sx occur. We discussed the sx which are most concerning (e.g., sudden worsening pain, fever, inability to tolerate by mouth) that necessitate immediate return. Medications administered to the patient during their visit and any new prescriptions provided to the patient are listed below.  Medications given during this visit Medications  gi cocktail (Maalox,Lidocaine,Donnatal) (30 mLs Oral Given 07/21/16 2128)     The patient appears reasonably screen and/or stabilized for discharge and I doubt any other medical condition or other [facility] requiring further screening, evaluation, or treatment in the ED at this time prior to discharge.    Final Clinical Impressions(s) / ED Diagnoses   Final diagnoses:  Epigastric abdominal pain    New Prescriptions Discharge Medication List as of 07/21/2016  9:25 PM       Melene Plan, DO 07/21/16 2328

## 2017-07-24 DIAGNOSIS — E291 Testicular hypofunction: Secondary | ICD-10-CM | POA: Diagnosis not present

## 2017-07-24 DIAGNOSIS — R1013 Epigastric pain: Secondary | ICD-10-CM | POA: Diagnosis not present

## 2017-07-24 DIAGNOSIS — D509 Iron deficiency anemia, unspecified: Secondary | ICD-10-CM | POA: Diagnosis not present

## 2017-07-24 DIAGNOSIS — E559 Vitamin D deficiency, unspecified: Secondary | ICD-10-CM | POA: Diagnosis not present

## 2017-07-24 DIAGNOSIS — Z7689 Persons encountering health services in other specified circumstances: Secondary | ICD-10-CM | POA: Diagnosis not present

## 2017-07-24 DIAGNOSIS — Z131 Encounter for screening for diabetes mellitus: Secondary | ICD-10-CM | POA: Diagnosis not present

## 2017-07-24 DIAGNOSIS — G629 Polyneuropathy, unspecified: Secondary | ICD-10-CM | POA: Diagnosis not present

## 2017-07-24 DIAGNOSIS — K921 Melena: Secondary | ICD-10-CM | POA: Diagnosis not present

## 2017-08-01 DIAGNOSIS — K625 Hemorrhage of anus and rectum: Secondary | ICD-10-CM | POA: Diagnosis not present

## 2017-08-01 DIAGNOSIS — Z1211 Encounter for screening for malignant neoplasm of colon: Secondary | ICD-10-CM | POA: Diagnosis not present

## 2017-08-07 DIAGNOSIS — R1013 Epigastric pain: Secondary | ICD-10-CM | POA: Diagnosis not present

## 2017-08-07 DIAGNOSIS — K921 Melena: Secondary | ICD-10-CM | POA: Diagnosis not present

## 2017-08-07 DIAGNOSIS — E559 Vitamin D deficiency, unspecified: Secondary | ICD-10-CM | POA: Diagnosis not present

## 2017-08-07 DIAGNOSIS — E291 Testicular hypofunction: Secondary | ICD-10-CM | POA: Diagnosis not present

## 2017-09-06 DIAGNOSIS — E291 Testicular hypofunction: Secondary | ICD-10-CM | POA: Diagnosis not present

## 2017-10-07 DIAGNOSIS — E291 Testicular hypofunction: Secondary | ICD-10-CM | POA: Diagnosis not present

## 2017-11-18 DIAGNOSIS — Z0000 Encounter for general adult medical examination without abnormal findings: Secondary | ICD-10-CM | POA: Diagnosis not present

## 2017-11-18 DIAGNOSIS — H538 Other visual disturbances: Secondary | ICD-10-CM | POA: Diagnosis not present

## 2017-11-18 DIAGNOSIS — E291 Testicular hypofunction: Secondary | ICD-10-CM | POA: Diagnosis not present

## 2017-11-18 DIAGNOSIS — Z01118 Encounter for examination of ears and hearing with other abnormal findings: Secondary | ICD-10-CM | POA: Diagnosis not present

## 2017-11-18 DIAGNOSIS — Z136 Encounter for screening for cardiovascular disorders: Secondary | ICD-10-CM | POA: Diagnosis not present

## 2017-11-18 DIAGNOSIS — Z131 Encounter for screening for diabetes mellitus: Secondary | ICD-10-CM | POA: Diagnosis not present

## 2018-12-09 ENCOUNTER — Ambulatory Visit (HOSPITAL_COMMUNITY)
Admission: EM | Admit: 2018-12-09 | Discharge: 2018-12-09 | Disposition: A | Discharge status: Home / Self Care | Payer: 59 | Source: Home / Self Care | Attending: Internal Medicine | Admitting: Internal Medicine

## 2018-12-09 ENCOUNTER — Other Ambulatory Visit: Payer: Self-pay

## 2018-12-09 ENCOUNTER — Encounter (HOSPITAL_COMMUNITY): Payer: Self-pay | Admitting: Emergency Medicine

## 2018-12-09 ENCOUNTER — Encounter (HOSPITAL_BASED_OUTPATIENT_CLINIC_OR_DEPARTMENT_OTHER): Payer: Self-pay

## 2018-12-09 ENCOUNTER — Emergency Department (HOSPITAL_BASED_OUTPATIENT_CLINIC_OR_DEPARTMENT_OTHER)
Admission: EM | Admit: 2018-12-09 | Discharge: 2018-12-09 | Disposition: A | Discharge status: Home / Self Care | Payer: 59 | Attending: Emergency Medicine | Admitting: Emergency Medicine

## 2018-12-09 ENCOUNTER — Emergency Department (HOSPITAL_BASED_OUTPATIENT_CLINIC_OR_DEPARTMENT_OTHER): Payer: 59

## 2018-12-09 DIAGNOSIS — Z79899 Other long term (current) drug therapy: Secondary | ICD-10-CM | POA: Diagnosis not present

## 2018-12-09 DIAGNOSIS — K625 Hemorrhage of anus and rectum: Secondary | ICD-10-CM | POA: Insufficient documentation

## 2018-12-09 DIAGNOSIS — Z87891 Personal history of nicotine dependence: Secondary | ICD-10-CM | POA: Diagnosis not present

## 2018-12-09 DIAGNOSIS — R197 Diarrhea, unspecified: Secondary | ICD-10-CM | POA: Diagnosis not present

## 2018-12-09 DIAGNOSIS — K529 Noninfective gastroenteritis and colitis, unspecified: Secondary | ICD-10-CM | POA: Diagnosis not present

## 2018-12-09 DIAGNOSIS — R1030 Lower abdominal pain, unspecified: Secondary | ICD-10-CM | POA: Diagnosis not present

## 2018-12-09 LAB — COMPREHENSIVE METABOLIC PANEL
ALBUMIN: 4.4 g/dL (ref 3.5–5.0)
ALT: 79 U/L — ABNORMAL HIGH (ref 0–44)
ANION GAP: 9 (ref 5–15)
AST: 38 U/L (ref 15–41)
Alkaline Phosphatase: 74 U/L (ref 38–126)
BUN: 13 mg/dL (ref 6–20)
CHLORIDE: 104 mmol/L (ref 98–111)
CO2: 23 mmol/L (ref 22–32)
Calcium: 8.9 mg/dL (ref 8.9–10.3)
Creatinine, Ser: 0.7 mg/dL (ref 0.61–1.24)
GFR calc Af Amer: >60 mL/min (ref >60)
GFR calc non Af Amer: >60 mL/min (ref >60)
GLUCOSE: 100 mg/dL — AB (ref 70–99)
Potassium: 4 mmol/L (ref 3.5–5.1)
Sodium: 136 mmol/L (ref 135–145)
TOTAL PROTEIN: 8 g/dL (ref 6.5–8.1)
Total Bilirubin: 0.5 mg/dL (ref 0.3–1.2)

## 2018-12-09 LAB — CBC WITH DIFFERENTIAL/PLATELET
ABS IMMATURE GRANULOCYTES: 0.03 10*3/uL (ref 0.00–0.07)
BASOS ABS: 0.1 10*3/uL (ref 0.0–0.1)
Basophils Relative: 1 %
Eosinophils Absolute: 1.2 10*3/uL — ABNORMAL HIGH (ref 0.0–0.5)
Eosinophils Relative: 10 %
HCT: 35.6 % — ABNORMAL LOW (ref 39.0–52.0)
Hemoglobin: 11.7 g/dL — ABNORMAL LOW (ref 13.0–17.0)
Immature Granulocytes: 0 %
LYMPHS ABS: 2.6 10*3/uL (ref 0.7–4.0)
Lymphocytes Relative: 22 %
MCH: 22.6 pg — ABNORMAL LOW (ref 26.0–34.0)
MCHC: 32.9 g/dL (ref 30.0–36.0)
MCV: 68.7 fL — ABNORMAL LOW (ref 80.0–100.0)
Monocytes Absolute: 0.9 10*3/uL (ref 0.1–1.0)
Monocytes Relative: 8 %
NEUTROS PCT: 59 %
Neutro Abs: 7 10*3/uL (ref 1.7–7.7)
PLATELETS: 215 10*3/uL (ref 150–400)
RBC: 5.18 MIL/uL (ref 4.22–5.81)
RDW: 14.6 % (ref 11.5–15.5)
Smear Review: NORMAL
WBC: 11.8 10*3/uL — ABNORMAL HIGH (ref 4.0–10.5)
nRBC: 0 % (ref 0.0–0.2)

## 2018-12-09 LAB — LIPASE, BLOOD: Lipase: 30 U/L (ref 11–51)

## 2018-12-09 LAB — OCCULT BLOOD X 1 CARD TO LAB, STOOL: Fecal Occult Bld: NEGATIVE

## 2018-12-09 MED ORDER — ONDANSETRON HCL 4 MG/2ML IJ SOLN
4.0000 mg | Freq: Once | INTRAMUSCULAR | Status: AC
  Administered 2018-12-09: 4 mg via INTRAVENOUS
  Filled 2018-12-09: qty 2

## 2018-12-09 MED ORDER — IOPAMIDOL (ISOVUE-300) INJECTION 61%
100.0000 mL | Freq: Once | INTRAVENOUS | Status: AC | PRN
  Administered 2018-12-09: 100 mL via INTRAVENOUS

## 2018-12-09 MED ORDER — MORPHINE SULFATE 15 MG PO TABS: 15.0000 mg | ORAL_TABLET | ORAL | 0 refills | Status: AC | PRN

## 2018-12-09 MED ORDER — AMOXICILLIN-POT CLAVULANATE 875-125 MG PO TABS: 1.0000 | ORAL_TABLET | Freq: Two times a day (BID) | ORAL | 0 refills | Status: AC

## 2018-12-09 MED ORDER — SODIUM CHLORIDE 0.9 % IV BOLUS
1000.0000 mL | Freq: Once | INTRAVENOUS | Status: AC
  Administered 2018-12-09: 1000 mL via INTRAVENOUS

## 2018-12-09 MED ORDER — ONDANSETRON 4 MG PO TBDP
4.0000 mg | ORAL_TABLET | Freq: Once | ORAL | Status: AC
  Administered 2018-12-09: 4 mg via ORAL
  Filled 2018-12-09: qty 1

## 2018-12-09 MED ORDER — MORPHINE SULFATE (PF) 4 MG/ML IV SOLN
4.0000 mg | Freq: Once | INTRAVENOUS | Status: AC
Start: 2018-12-09 — End: 2018-12-09
  Administered 2018-12-09: 4 mg via INTRAVENOUS
  Filled 2018-12-09: qty 1

## 2018-12-09 MED ORDER — ONDANSETRON 4 MG PO TBDP: ORAL_TABLET | ORAL | 0 refills | Status: AC

## 2018-12-09 MED ORDER — AMOXICILLIN-POT CLAVULANATE 875-125 MG PO TABS
1.0000 | ORAL_TABLET | Freq: Once | ORAL | Status: AC
  Administered 2018-12-09: 1 via ORAL
  Filled 2018-12-09: qty 1

## 2018-12-09 MED ORDER — OXYCODONE HCL 5 MG PO TABS
5.0000 mg | ORAL_TABLET | Freq: Once | ORAL | Status: AC
  Administered 2018-12-09: 5 mg via ORAL
  Filled 2018-12-09: qty 1

## 2018-12-09 NOTE — ED Triage Notes (Signed)
Pt c/o generalized abdominal pain and cramping, states he feels bloated. Nontender. Pain since Sunday.

## 2018-12-09 NOTE — ED Provider Notes (Signed)
[facility]    CSN: [id number] Arrival date & time: 12/09/18  1914     History   Chief Complaint Chief Complaint  Patient presents with  . Abdominal Pain    HPI Tanner Robinson is a 58 y.o. male.   58 year old male with chronic back pain presents to urgent care complaining of abdominal pain, diarrhea and bloody stools x4 days.  He denies fever or rash.  He states that his upper abdomen hurts after he eats which prompted him to seek evaluation today; but he also admits to lower abdominal pain that is more cramping in quality.  This lower abdominal pain has worsened over the last 2 days.  Bowel movements are painless.     Past Medical History:  Diagnosis Date  . Allergy, unspecified not elsewhere classified   . Disturbance of skin sensation   . Iron deficiency anemia, unspecified   . Numbness and tingling of both legs   . Other testicular hypofunction   . Pain in limb   . Polyneuropathy in other diseases classified elsewhere (HCC)   . Unspecified vitamin D deficiency   . Urinary frequency     Patient Active Problem List   Diagnosis Date Noted  . Lumbar radiculopathy 10/25/2014  . Strain of calf muscle 09/27/2014  . Ischial bursitis of left side 09/06/2014  . 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 Hamstring tendinitis of left thigh 07/02/2014    Past Surgical History:  Procedure Laterality Date  . APPENDECTOMY         Home Medications    Prior to Admission medications   Medication Sig Start Date End Date Taking? Authorizing Provider  loratadine (CLARITIN) 10 MG tablet Take 10 mg by mouth daily.   Yes [provider]  azelastine (OPTIVAR) 0.05 % ophthalmic solution Place 1 drop into the right eye 2 (two) times daily. 04/28/16   Shade FloodGreene, Jeffrey R, MD  Cholecalciferol (VITAMIN D3) 2000 UNITS TABS Take 1 tablet by mouth daily. Reported on 04/28/2016    [provider]  gabapentin (NEURONTIN) 300 MG capsule Take 300 mg by  mouth 3 (three) times daily. Reported on 04/28/2016    [provider]  Multiple Vitamin (MULTIVITAMIN) tablet Take 1 tablet by mouth daily. Reported on 04/28/2016    [provider]  ofloxacin (FLOXIN OTIC) 0.3 % otic solution Place 10 drops into the right ear daily. 04/28/16   Shade FloodGreene, Jeffrey R, MD  testosterone Cathren Laine(ANDRODERM) 4 MG/24HR PT24 patch Place 1 patch onto the skin daily. Reported on 04/28/2016    [provider]  triamcinolone cream (KENALOG) 0.1 % Apply 1 application topically 2 (two) times daily. 04/28/16   Shade FloodGreene, Jeffrey R, MD    Family History Family History  Problem Relation Age of Onset  . Liver cancer Father        Deceased, 8161  . Other Mother        Unknown  . Healthy Son   . Healthy Daughter     Social History Social History   Tobacco Use  . Smoking status: Former Smoker    Packs/day: 1.00    Years: 20.00    Pack years: 20.00    Types: Cigarettes    Last attempt to quit: 12/04/1999    Years since quitting: 19.0  . Smokeless tobacco: Never Used  Substance Use Topics  . Alcohol use: Yes    Comment: occ   . Drug use: No     Allergies   Patient has  no known allergies.   Review of Systems Review of Systems  Constitutional: Negative for chills and fever.  HENT: Negative for sore throat and tinnitus.   Eyes: Negative for redness.  Respiratory: Negative for cough and shortness of breath.   Cardiovascular: Negative for chest pain and palpitations.  Gastrointestinal: Positive for abdominal pain, blood in stool and diarrhea. Negative for nausea and vomiting.  Genitourinary: Negative for dysuria, frequency and urgency.  Musculoskeletal: Negative for myalgias.  Skin: Negative for rash.       No lesions  Neurological: Negative for weakness.  Hematological: Does not bruise/bleed easily.  Psychiatric/Behavioral: Negative for suicidal ideas.     Physical Exam Triage Vital Signs ED Triage Vitals  Enc Vitals Group     BP 12/09/18  1934 139/69     Pulse Rate 12/09/18 1934 89     Resp 12/09/18 1934 18     Temp 12/09/18 1934 98.8 F (37.1 C)     Temp src --      SpO2 12/09/18 1934 97 %     Weight --      Height --      Head Circumference --      Peak Flow --      Pain Score 12/09/18 1936 10     Pain Loc --      Pain Edu? --      Excl. in GC? --    No data found.  Updated Vital Signs BP 139/69   Pulse 89   Temp 98.8 F (37.1 C)   Resp 18   SpO2 97%   Visual Acuity Right Eye Distance:   Left Eye Distance:   Bilateral Distance:    Right Eye Near:   Left Eye Near:    Bilateral Near:     Physical Exam Constitutional:      General: He is not in acute distress.    Appearance: He is well-developed.  HENT:     Head: Normocephalic and atraumatic.  Eyes:     General: No scleral icterus.    Conjunctiva/sclera: Conjunctivae normal.     Pupils: Pupils are equal, round, and reactive to light.  Neck:     Musculoskeletal: Normal range of motion and neck supple.     Thyroid: No thyromegaly.     Vascular: No JVD.     Trachea: No tracheal deviation.  Cardiovascular:     Rate and Rhythm: Normal rate and regular rhythm.     Heart sounds: Normal heart sounds. No murmur. No friction rub. No gallop.   Pulmonary:     Effort: Pulmonary effort is normal. No respiratory distress.     Breath sounds: Normal breath sounds.  Abdominal:     General: Bowel sounds are normal. There is no distension.     Palpations: Abdomen is soft.     Tenderness: There is abdominal tenderness in the right lower quadrant, epigastric area and suprapubic area. There is no guarding or rebound.  Musculoskeletal: Normal range of motion.  Lymphadenopathy:     Cervical: No cervical adenopathy.  Skin:    General: Skin is warm and dry.     Findings: No erythema or rash.  Neurological:     Mental Status: He is alert and oriented to person, place, and time.     Cranial Nerves: No cranial nerve deficit.  Psychiatric:        Behavior:  Behavior normal.        Thought Content: Thought content normal.  Judgment: Judgment normal.      UC Treatments / Results  Labs (all labs ordered are listed, but only abnormal results are displayed) Labs Reviewed - No data to display  EKG None  Radiology No results found.  Procedures Procedures (including critical care time)  Medications Ordered in UC Medications - No data to display  Initial Impression / Assessment and Plan / UC Course  I have reviewed the triage vital signs and the nursing notes.  Pertinent labs & imaging results that were available during my care of the patient were reviewed by me and considered in my medical decision making (see chart for details).     Abdominal pain and rectal bleeding are concerning for GI bleeding as opposed to hemorrhoids.  This is the first time patient has had the symptoms.  No evidence for inflammatory bowel disease.  The patient will go to the emergency department for further evaluation and imaging.  Final Clinical Impressions(s) / UC Diagnoses   Final diagnoses:  Rectal bleeding   Discharge Instructions   None    ED Prescriptions    None     Controlled Substance Prescriptions [place] Controlled Substance Registry consulted? Not Applicable   Arnaldo Natal, MD 12/09/18 2053

## 2018-12-09 NOTE — ED Provider Notes (Signed)
[facility] EMERGENCY DEPARTMENT Provider Note   CSN: [id number] Arrival date & time: 12/09/18  2032     History   Chief Complaint Chief Complaint  Patient presents with  . Abdominal Pain    HPI Tanner Robinson is a 57 y.o. male.  58 yo M with a chief complaint of diffuse lower abdominal pain.  This been going on for the past 2 or 3 days.  Patient has had some subjective fevers and chills.  Also describing little specks of blood in his stool.  He went to urgent care and was sent here for evaluation.  The history is provided by the patient and the spouse.  Abdominal Pain  Pain location:  Generalized Pain quality: aching, bloating, burning and cramping   Pain radiates to:  Does not radiate Pain severity:  Moderate Onset quality:  Sudden Duration:  2 days Timing:  Constant Progression:  Worsening   Past Medical History:  Diagnosis Date  . Allergy, unspecified not elsewhere classified   . Disturbance of skin sensation   . Iron deficiency anemia, unspecified   . Numbness and tingling of both legs   . Other testicular hypofunction   . Pain in limb   . Polyneuropathy in other diseases classified elsewhere (HCC)   . Unspecified vitamin D deficiency   . Urinary frequency     Patient Active Problem List   Diagnosis Date Noted  . Lumbar radiculopathy 10/25/2014  . Strain of calf muscle 09/27/2014  . Ischial bursitis of left side 09/06/2014  . 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 Hamstring tendinitis of left thigh 07/02/2014    Past Surgical History:  Procedure Laterality Date  . APPENDECTOMY          Home Medications    Prior to Admission medications   Medication Sig Start Date End Date Taking? Authorizing Provider  amoxicillin-clavulanate (AUGMENTIN) 875-125 MG tablet Take 1 tablet by mouth every 12 (twelve) hours. 12/09/18   Melene Plan, DO  azelastine (OPTIVAR) 0.05 % ophthalmic solution Place 1 drop into the right eye 2 (two) times  daily. 04/28/16   [name], MD  Cholecalciferol (VITAMIN D3) 2000 UNITS TABS Take 1 tablet by mouth daily. Reported on 04/28/2016    [provider]  gabapentin (NEURONTIN) 300 MG capsule Take 300 mg by mouth 3 (three) times daily. Reported on 04/28/2016    [provider]  loratadine (CLARITIN) 10 MG tablet Take 10 mg by mouth daily.    [provider]  morphine (MSIR) 15 MG tablet Take 1 tablet (15 mg total) by mouth every 4 (four) hours as needed for severe pain. 12/09/18   Melene Plan, DO  Multiple Vitamin (MULTIVITAMIN) tablet Take 1 tablet by mouth daily. Reported on 04/28/2016    [provider]  ofloxacin (FLOXIN OTIC) 0.3 % otic solution Place 10 drops into the right ear daily. 04/28/16   [name], MD  ondansetron (ZOFRAN ODT) 4 MG disintegrating tablet 4mg  ODT q4 hours prn nausea/vomit 12/09/18   Melene Plan, DO  testosterone (ANDRODERM) 4 MG/24HR PT24 patch Place 1 patch onto the skin daily. Reported on 04/28/2016    [provider]  triamcinolone cream (KENALOG) 0.1 % Apply 1 application topically 2 (two) times daily. 04/28/16   [name], MD    Family History Family History  Problem Relation Age of Onset  . Liver cancer Father        Deceased, 71  . Other Mother  Unknown  . Healthy Son   . Healthy Daughter     Social History Social History   Tobacco Use  . Smoking status: Former Smoker    Packs/day: 1.00    Years: 20.00    Pack years: 20.00    Types: Cigarettes    Last attempt to quit: 12/04/1999    Years since quitting: 19.0  . Smokeless tobacco: Never Used  Substance Use Topics  . Alcohol use: Yes    Comment: occ   . Drug use: No     Allergies   Patient has no known allergies.   Review of Systems Review of Systems  Gastrointestinal: Positive for abdominal pain.     Physical Exam Updated Vital Signs BP (!) 156/83 (BP Location: Left Arm)   Pulse 90   Temp 98.5 F (36.9 C) (Oral)   Resp  18   Ht 5\' 3"  (1.6 m)   Wt 74.7 kg   SpO2 98%   BMI 29.16 kg/m   Physical Exam Vitals signs and nursing note reviewed.  Constitutional:      Appearance: He is well-developed.  HENT:     Head: Normocephalic and atraumatic.  Eyes:     Pupils: Pupils are equal, round, and reactive to light.  Neck:     Musculoskeletal: Normal range of motion and neck supple.     Vascular: No JVD.  Cardiovascular:     Rate and Rhythm: Normal rate and regular rhythm.     Heart sounds: No murmur. No friction rub. No gallop.   Pulmonary:     Effort: No respiratory distress.     Breath sounds: No wheezing.  Abdominal:     General: There is distension.     Tenderness: There is abdominal tenderness. There is no guarding or rebound.     Comments: Mild distention, mild tenderness diffusely about the lower abdomen  Musculoskeletal: Normal range of motion.  Skin:    Coloration: Skin is not pale.     Findings: No rash.  Neurological:     Mental Status: He is alert and oriented to person, place, and time.  Psychiatric:        Behavior: Behavior normal.      ED Treatments / Results  Labs (all labs ordered are listed, but only abnormal results are displayed) Labs Reviewed  CBC WITH DIFFERENTIAL/PLATELET - Abnormal; Notable for the following components:      Result Value   WBC 11.8 (*)    Hemoglobin 11.7 (*)    HCT 35.6 (*)    MCV 68.7 (*)    MCH 22.6 (*)    Eosinophils Absolute 1.2 (*)    All other components within normal limits  COMPREHENSIVE METABOLIC PANEL - Abnormal; Notable for the following components:   Glucose, Bld 100 (*)    ALT 79 (*)    All other components within normal limits  LIPASE, BLOOD  OCCULT BLOOD X 1 CARD TO LAB, STOOL    EKG None  Radiology Ct Abdomen Pelvis W Contrast  Result Date: 12/09/2018 CLINICAL DATA:  Abd pain, acute, generalized. Abdominal pain, diarrhea and bloody stools for 4 days. EXAM: CT ABDOMEN AND PELVIS WITH CONTRAST TECHNIQUE: Multidetector CT  imaging of the abdomen and pelvis was performed using the standard protocol following bolus administration of intravenous contrast. CONTRAST:  ISOVUE-300 IOPAMIDOL (ISOVUE-300) INJECTION 61% COMPARISON:  None. FINDINGS: Lower chest: Minor atelectasis in the left lower lobe. No confluent consolidation or pleural fluid. Hepatobiliary: Borderline hepatic steatosis. No discrete  focal lesion. Gallbladder physiologically distended, no calcified stone. No biliary dilatation. Pancreas: No ductal dilatation or inflammation. Spleen: Normal in size without focal abnormality. Adrenals/Urinary Tract: Normal adrenal glands. No hydronephrosis or perinephric edema. Homogeneous renal enhancement with symmetric excretion on delayed phase imaging. Simple cyst in the lower right kidney with adjacent mild cortical scarring. Urinary bladder is physiologically distended without wall thickening. Stomach/Bowel: Wall thickening with associated perienteric edema of the terminal ileum, cecum, and ascending colon. Patient is post appendectomy. The remainder of the colon is unremarkable with small volume of colonic stool. No additional small bowel inflammation. Stomach is nondistended. Vascular/Lymphatic: Prominent and mildly enlarged lymph nodes in the ileocolic chain, largest measuring 10 mm in the region of bowel inflammation. Small retroperitoneal nodes are likely reactive. Mesenteric vessels and portal vein are patent. Mild aorta bi-iliac atherosclerosis. Reproductive: Prostate is unremarkable. Other: No free air free fluid. Postsurgical changes the anterior abdominal wall. Musculoskeletal: There are no acute or suspicious osseous abnormalities. No sacroiliac joint inflammation. IMPRESSION: 1. Enteritis/colitis involving the terminal ileum, cecum, and ascending colon. This may be infectious or inflammatory. Given distribution, Crohn's disease is considered. 2. Prominent and mildly enlarged lymph nodes in the ileocolic chain are likely  reactive. 3. Mild hepatic steatosis. 4. Aortic Atherosclerosis (ICD10-I70.0). Electronically Signed   By: Narda Rutherford M.D.   On: 12/09/2018 22:54    Procedures Procedures (including critical care time)  Medications Ordered in ED Medications  sodium chloride 0.9 % bolus 1,000 mL (1,000 mLs Intravenous New Bag/Given 12/09/18 2138)  morphine 4 MG/ML injection 4 mg (4 mg Intravenous Given 12/09/18 2139)  ondansetron (ZOFRAN) injection 4 mg (4 mg Intravenous Given 12/09/18 2139)  iopamidol (ISOVUE-300) 61 % injection 100 mL (100 mLs Intravenous Contrast Given 12/09/18 2228)     Initial Impression / Assessment and Plan / ED Course  I have reviewed the triage vital signs and the nursing notes.  Pertinent labs & imaging results that were available during my care of the patient were reviewed by me and considered in my medical decision making (see chart for details).     58 yo M with a chief complaints of lower abdominal pain.  Going on for the past couple days.  Having diarrhea with it as well.  My exam with mild diffuse lower abdominal tenderness.  Patient does have mild leukocytosis, LFTs and lipase are unremarkable.  CT scan shows inflammation of the terminal ileum.  Likely colitis, will treat as infectious.  Patient is feeling much better on reassessment.  Tolerating p.o.  Patient has never had a colonoscopy or endoscopy we will give him GI follow-up.  11:12 PM:  I have discussed the diagnosis/risks/treatment options with the patient and family and believe the pt to be eligible for discharge home to follow-up with PCP, GI. We also discussed returning to the ED immediately if new or worsening sx occur. We discussed the sx which are most concerning (e.g., sudden worsening pain, fever, inability to tolerate by mouth) that necessitate immediate return. Medications administered to the patient during their visit and any new prescriptions provided to the patient are listed below.  Medications given during  this visit Medications  amoxicillin-clavulanate (AUGMENTIN) 875-125 MG per tablet 1 tablet (has no administration in time range)  oxyCODONE (Oxy IR/ROXICODONE) immediate release tablet 5 mg (has no administration in time range)  ondansetron (ZOFRAN-ODT) disintegrating tablet 4 mg (has no administration in time range)  sodium chloride 0.9 % bolus 1,000 mL (1,000 mLs Intravenous New Bag/Given 12/09/18 2138)  morphine 4 MG/ML injection 4 mg (4 mg Intravenous Given 12/09/18 2139)  ondansetron [facility](ZOFRAN) injection 4 mg (4 mg Intravenous Given 12/09/18 2139)  iopamidol (ISOVUE-300) 61 % injection 100 mL (100 mLs Intravenous Contrast Given 12/09/18 2228)      The patient appears reasonably screen and/or stabilized for discharge and I doubt any other medical condition or other [facility] requiring further screening, evaluation, or treatment in the ED at this time prior to discharge.    Final Clinical Impressions(s) / ED Diagnoses   Final diagnoses:  Colitis    ED Discharge Orders         Ordered    amoxicillin-clavulanate (AUGMENTIN) 875-125 MG tablet  Every 12 hours     12/09/18 2306    morphine (MSIR) 15 MG tablet  Every 4 hours PRN     12/09/18 2308    ondansetron (ZOFRAN ODT) 4 MG disintegrating tablet     12/09/18 2308           Melene PlanFloyd, Marselino Slayton, DO 12/09/18 2312

## 2018-12-09 NOTE — Discharge Instructions (Addendum)
Follow up with your family doc and GI.  Take the antibiotics as prescribed.    Take 4 over the counter ibuprofen tablets 3 times a day or 2 over-the-counter naproxen tablets twice a day for pain. Also take tylenol 1000mg (2 extra strength) four times a day.   Then take the pain medicine if you feel like you need it. Narcotics do not help with the pain, they only make you care about it less.  You can become addicted to this, people may break into your house to steal it.  It will constipate you.  If you drive under the influence of this medicine you can get a DUI.

## 2018-12-09 NOTE — ED Triage Notes (Signed)
Pt c/o abd pain, diarrhea x 4 days-both are worse after eating-blood in stools yesterday-NAD-steady gait-sent from [facility]

## 2018-12-09 NOTE — ED Notes (Signed)
PT states understanding of care given, follow up care, and medication prescribed. PT ambulated from ED to car with a steady gait. 

## 2018-12-11 DIAGNOSIS — K529 Noninfective gastroenteritis and colitis, unspecified: Secondary | ICD-10-CM | POA: Diagnosis not present

## 2018-12-11 DIAGNOSIS — T7849XA Other allergy, initial encounter: Secondary | ICD-10-CM | POA: Diagnosis not present

## 2018-12-27 DIAGNOSIS — K529 Noninfective gastroenteritis and colitis, unspecified: Secondary | ICD-10-CM | POA: Diagnosis not present

## 2018-12-27 DIAGNOSIS — Z1211 Encounter for screening for malignant neoplasm of colon: Secondary | ICD-10-CM | POA: Diagnosis not present

## 2019-01-05 DIAGNOSIS — K635 Polyp of colon: Secondary | ICD-10-CM | POA: Diagnosis not present

## 2019-01-05 DIAGNOSIS — Z1211 Encounter for screening for malignant neoplasm of colon: Secondary | ICD-10-CM | POA: Diagnosis not present

## 2019-01-05 DIAGNOSIS — Z01818 Encounter for other preprocedural examination: Secondary | ICD-10-CM | POA: Diagnosis not present

## 2019-01-07 DIAGNOSIS — K635 Polyp of colon: Secondary | ICD-10-CM | POA: Diagnosis not present

## 2019-02-22 IMAGING — CT CT ABD-PELV W/ CM
2 of 5 series · 15 of 46 positions shown, 17 images · IV contrast (APPLIED)
Comparison: None.

CLINICAL DATA: Abd pain, acute, generalized. Abdominal pain,
diarrhea and bloody stools for 4 days.

EXAM:
CT ABDOMEN AND PELVIS WITH CONTRAST
TECHNIQUE: Multidetector CT imaging of the abdomen and pelvis was performed
using the standard protocol following bolus administration of
intravenous contrast.
CONTRAST:  100mL RN6NJS-LEE IOPAMIDOL (RN6NJS-LEE) INJECTION 61%

[Series 2: axial st · axial · 0.77mm/px · z∈[-606,-206]mm · 12 of 92 slices shown, 14 images]
[im 6/92  soft-tissue]
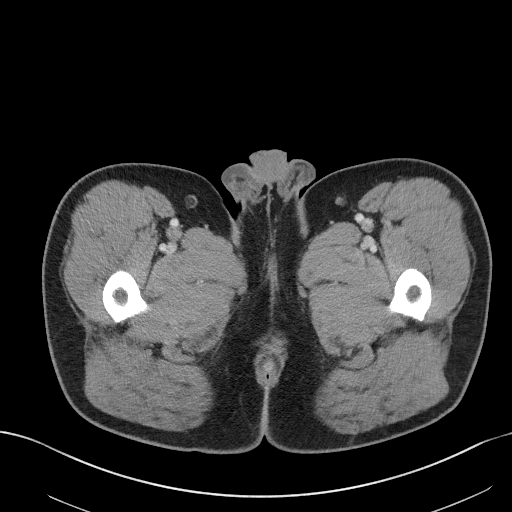
[im 6/92  bone]
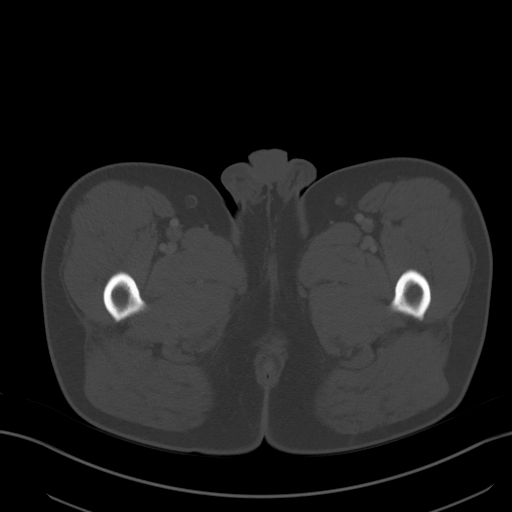
[im 16/92  soft-tissue]
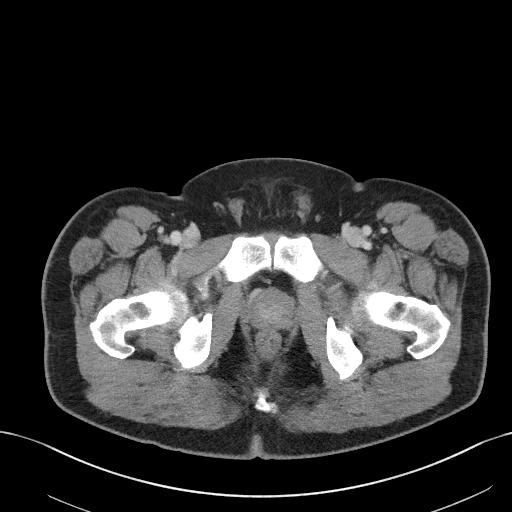
[im 21/92  soft-tissue]
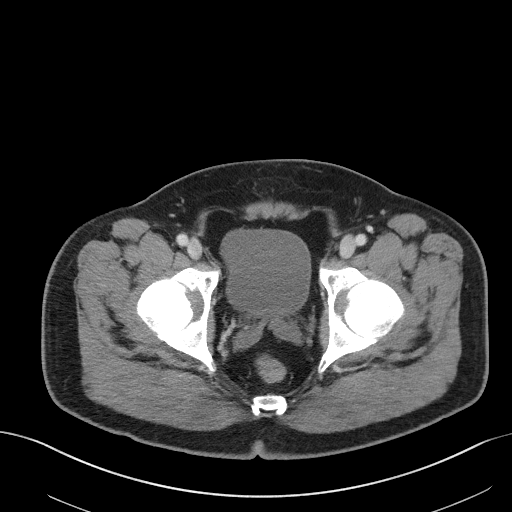
[im 26/92  soft-tissue]
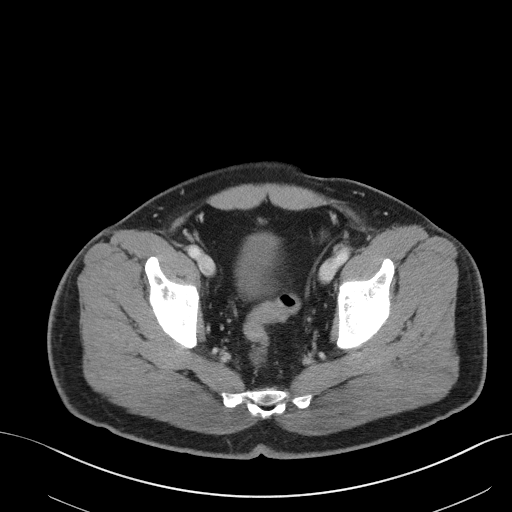
[im 36/92  soft-tissue]
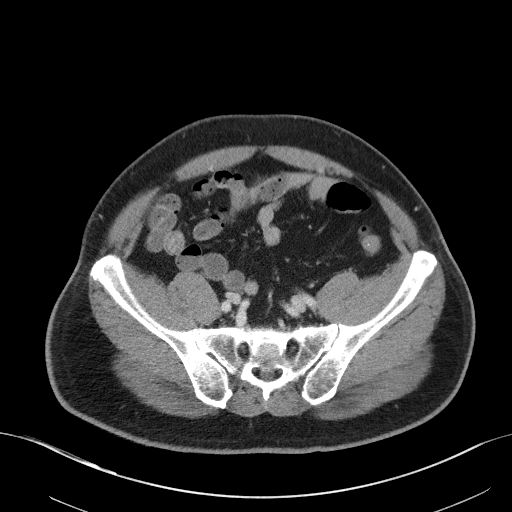
[im 41/92  soft-tissue]
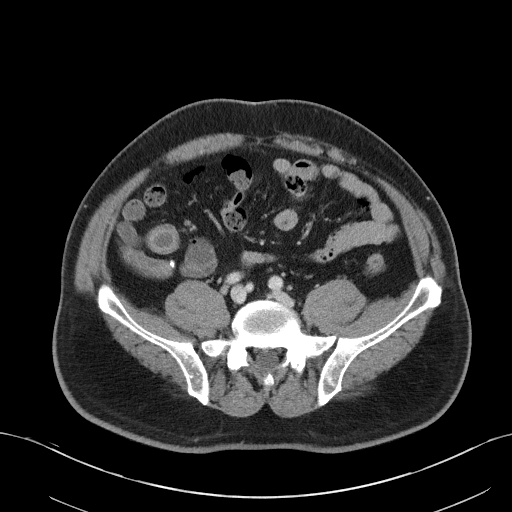
[im 51/92  soft-tissue]
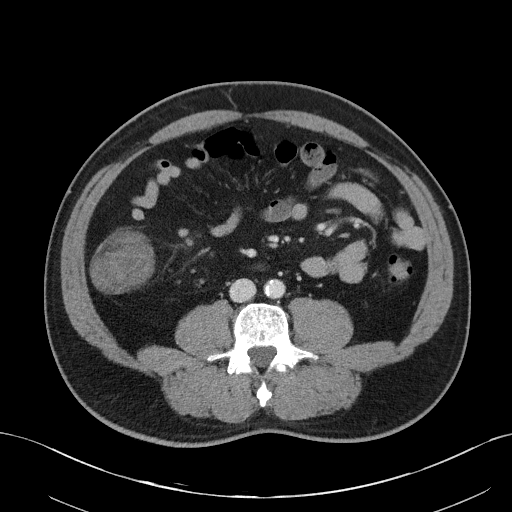
[im 56/92  soft-tissue]
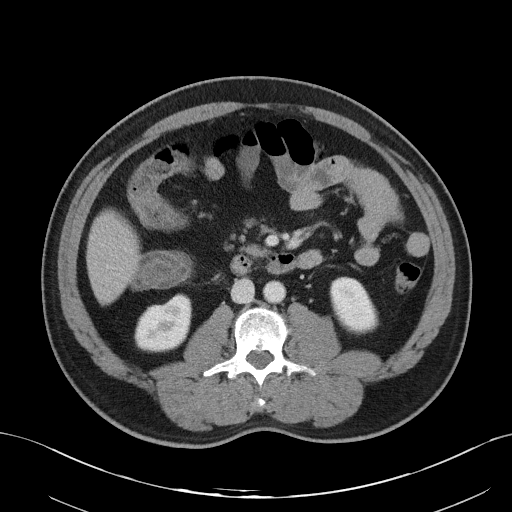
[im 66/92  soft-tissue]
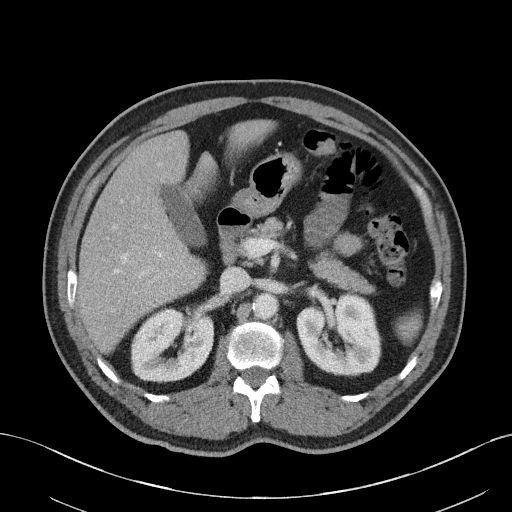
[im 66/92  bone]
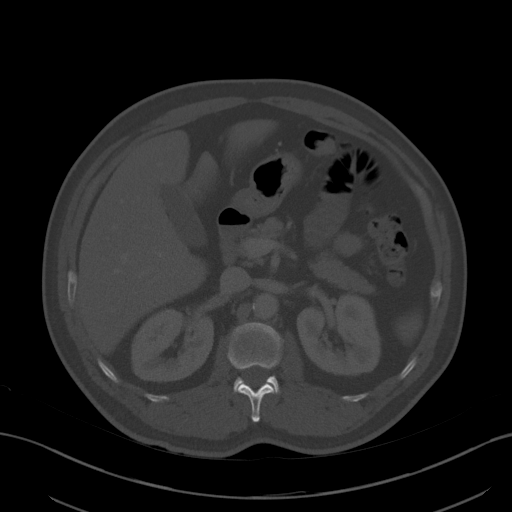
[im 71/92  soft-tissue]
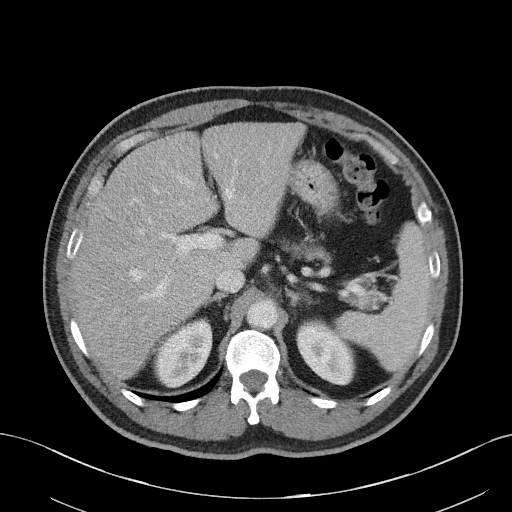
[im 76/92  soft-tissue]
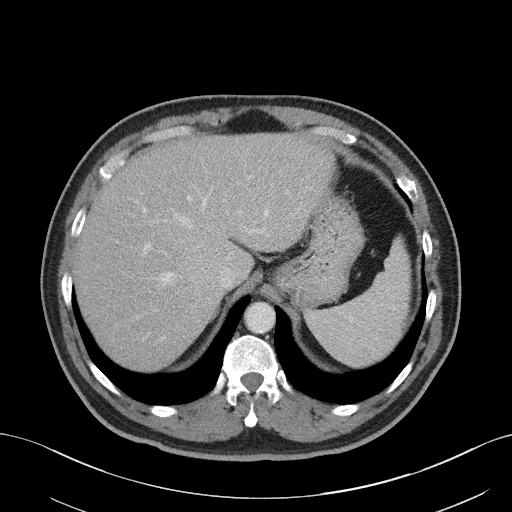
[im 86/92  soft-tissue]
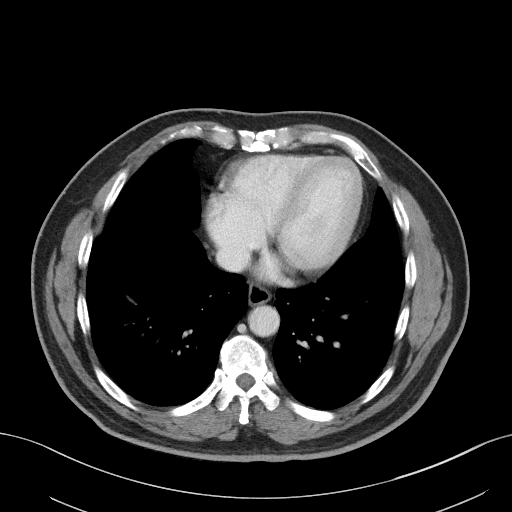

[Series 5: coronal st · coronal · 0.74mm/px · 3 of 101 slices shown]
[im 34/101  soft-tissue]
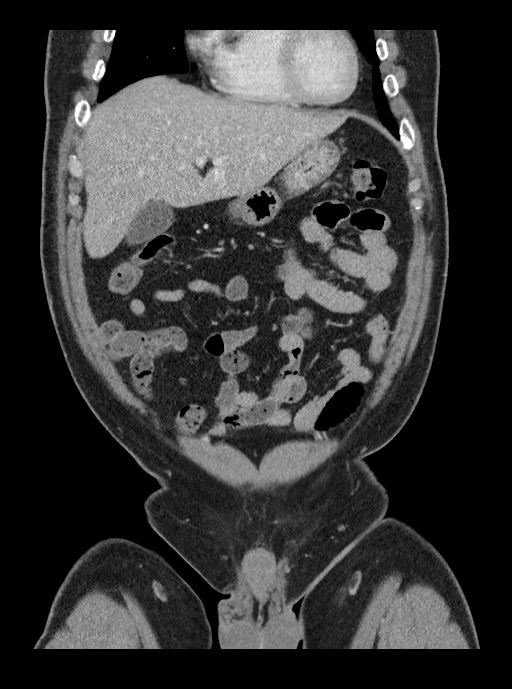
[im 45/101  soft-tissue]
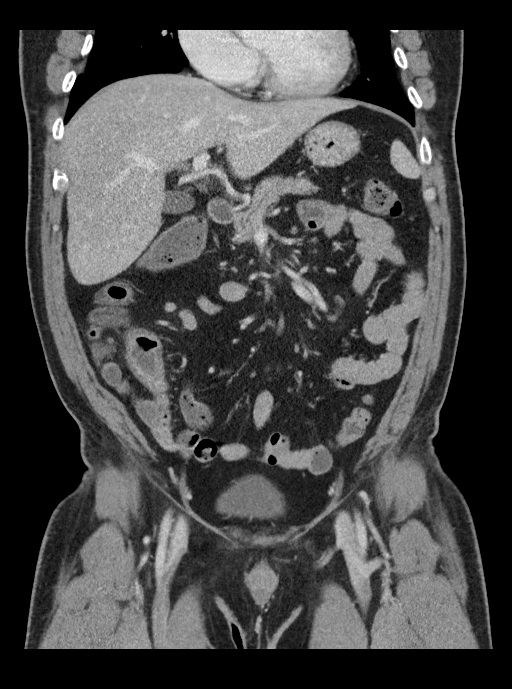
[im 56/101  soft-tissue]
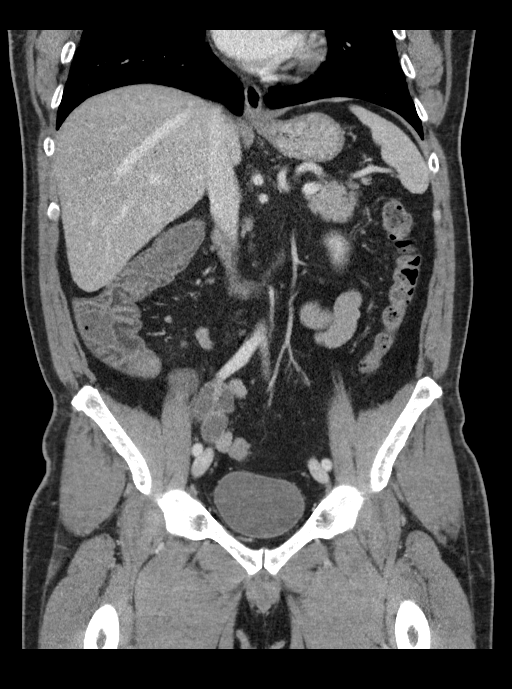

[15 of 46 positions shown; findings below may reference images not displayed]

FINDINGS: Lower chest: Minor atelectasis in the left lower lobe. No confluent
consolidation or pleural fluid.

Hepatobiliary: Borderline hepatic steatosis. No discrete focal
lesion. Gallbladder physiologically distended, no calcified stone.
No biliary dilatation.

Pancreas: No ductal dilatation or inflammation.

Spleen: Normal in size without focal abnormality.

Adrenals/Urinary Tract: Normal adrenal glands. No hydronephrosis or
perinephric edema. Homogeneous renal enhancement with symmetric
excretion on delayed phase imaging. Simple cyst in the lower right
kidney with adjacent mild cortical scarring. Urinary bladder is
physiologically distended without wall thickening.

Stomach/Bowel: Wall thickening with associated perienteric edema of
the terminal ileum, cecum, and ascending colon. Patient is post
appendectomy. The remainder of the colon is unremarkable with small
volume of colonic stool. No additional small bowel inflammation.
Stomach is nondistended.

Vascular/Lymphatic: Prominent and mildly enlarged lymph nodes in the
ileocolic chain, largest measuring 10 mm in the region of bowel
inflammation. Small retroperitoneal nodes are likely reactive.
Mesenteric vessels and portal vein are patent. Mild aorta bi-iliac
atherosclerosis.

Reproductive: Prostate is unremarkable.

Other: No free air free fluid. Postsurgical changes the anterior
abdominal wall.

Musculoskeletal: There are no acute or suspicious osseous
abnormalities. No sacroiliac joint inflammation.
IMPRESSION: 1. Enteritis/colitis involving the terminal ileum, cecum, and
ascending colon. This may be infectious or inflammatory. Given
distribution, Crohn's disease is considered.
2. Prominent and mildly enlarged lymph nodes in the ileocolic chain
are likely reactive.
3. Mild hepatic steatosis.
4. Aortic Atherosclerosis ([id number]).

## 2022-07-17 ENCOUNTER — Encounter: Payer: Self-pay | Admitting: *Deleted
